# Patient Record
Sex: Male | Born: 1955
Health system: Southern US, Community
[De-identification: ages and names within clinical notes are randomized; demographics above are authoritative.]

## PROBLEM LIST (undated history)

## (undated) HISTORY — PX: COLONOSCOPY: SHX174

---

## 2016-06-02 DIAGNOSIS — K080 Exfoliation of teeth due to systemic causes: Secondary | ICD-10-CM | POA: Diagnosis not present

## 2016-12-14 DIAGNOSIS — Z6824 Body mass index (BMI) 24.0-24.9, adult: Secondary | ICD-10-CM | POA: Diagnosis not present

## 2016-12-14 DIAGNOSIS — M5431 Sciatica, right side: Secondary | ICD-10-CM | POA: Diagnosis not present

## 2017-01-11 DIAGNOSIS — K080 Exfoliation of teeth due to systemic causes: Secondary | ICD-10-CM | POA: Diagnosis not present

## 2017-01-26 DIAGNOSIS — K529 Noninfective gastroenteritis and colitis, unspecified: Secondary | ICD-10-CM | POA: Diagnosis not present

## 2017-01-26 DIAGNOSIS — R109 Unspecified abdominal pain: Secondary | ICD-10-CM | POA: Diagnosis not present

## 2017-01-26 DIAGNOSIS — R1110 Vomiting, unspecified: Secondary | ICD-10-CM | POA: Diagnosis not present

## 2017-02-04 DIAGNOSIS — R140 Abdominal distension (gaseous): Secondary | ICD-10-CM | POA: Diagnosis not present

## 2017-02-04 DIAGNOSIS — R1084 Generalized abdominal pain: Secondary | ICD-10-CM | POA: Diagnosis not present

## 2017-02-24 DIAGNOSIS — K080 Exfoliation of teeth due to systemic causes: Secondary | ICD-10-CM | POA: Diagnosis not present

## 2017-02-25 DIAGNOSIS — M5431 Sciatica, right side: Secondary | ICD-10-CM | POA: Diagnosis not present

## 2017-02-25 DIAGNOSIS — Z6825 Body mass index (BMI) 25.0-25.9, adult: Secondary | ICD-10-CM | POA: Diagnosis not present

## 2017-02-27 DIAGNOSIS — M5416 Radiculopathy, lumbar region: Secondary | ICD-10-CM | POA: Diagnosis not present

## 2017-02-27 DIAGNOSIS — M545 Low back pain: Secondary | ICD-10-CM | POA: Diagnosis not present

## 2017-03-03 DIAGNOSIS — M5416 Radiculopathy, lumbar region: Secondary | ICD-10-CM | POA: Diagnosis not present

## 2017-03-08 DIAGNOSIS — M5416 Radiculopathy, lumbar region: Secondary | ICD-10-CM | POA: Diagnosis not present

## 2017-03-08 DIAGNOSIS — M5126 Other intervertebral disc displacement, lumbar region: Secondary | ICD-10-CM | POA: Diagnosis not present

## 2017-03-10 DIAGNOSIS — M5416 Radiculopathy, lumbar region: Secondary | ICD-10-CM | POA: Diagnosis not present

## 2017-08-11 DIAGNOSIS — L821 Other seborrheic keratosis: Secondary | ICD-10-CM | POA: Diagnosis not present

## 2017-08-11 DIAGNOSIS — D1722 Benign lipomatous neoplasm of skin and subcutaneous tissue of left arm: Secondary | ICD-10-CM | POA: Diagnosis not present

## 2017-08-11 DIAGNOSIS — K080 Exfoliation of teeth due to systemic causes: Secondary | ICD-10-CM | POA: Diagnosis not present

## 2017-08-11 DIAGNOSIS — L820 Inflamed seborrheic keratosis: Secondary | ICD-10-CM | POA: Diagnosis not present

## 2017-08-16 DIAGNOSIS — Z6825 Body mass index (BMI) 25.0-25.9, adult: Secondary | ICD-10-CM | POA: Diagnosis not present

## 2017-08-16 DIAGNOSIS — J029 Acute pharyngitis, unspecified: Secondary | ICD-10-CM | POA: Diagnosis not present

## 2018-01-03 DIAGNOSIS — Z125 Encounter for screening for malignant neoplasm of prostate: Secondary | ICD-10-CM | POA: Diagnosis not present

## 2018-01-03 DIAGNOSIS — Z131 Encounter for screening for diabetes mellitus: Secondary | ICD-10-CM | POA: Diagnosis not present

## 2018-01-03 DIAGNOSIS — Z13220 Encounter for screening for lipoid disorders: Secondary | ICD-10-CM | POA: Diagnosis not present

## 2018-01-17 DIAGNOSIS — E785 Hyperlipidemia, unspecified: Secondary | ICD-10-CM | POA: Diagnosis not present

## 2018-01-17 DIAGNOSIS — Z0000 Encounter for general adult medical examination without abnormal findings: Secondary | ICD-10-CM | POA: Diagnosis not present

## 2018-01-17 DIAGNOSIS — Z6824 Body mass index (BMI) 24.0-24.9, adult: Secondary | ICD-10-CM | POA: Diagnosis not present

## 2018-02-28 DIAGNOSIS — K080 Exfoliation of teeth due to systemic causes: Secondary | ICD-10-CM | POA: Diagnosis not present

## 2018-04-18 DIAGNOSIS — E785 Hyperlipidemia, unspecified: Secondary | ICD-10-CM | POA: Diagnosis not present

## 2018-04-25 DIAGNOSIS — E785 Hyperlipidemia, unspecified: Secondary | ICD-10-CM | POA: Diagnosis not present

## 2018-04-25 DIAGNOSIS — Z6822 Body mass index (BMI) 22.0-22.9, adult: Secondary | ICD-10-CM | POA: Diagnosis not present

## 2018-10-17 DIAGNOSIS — K080 Exfoliation of teeth due to systemic causes: Secondary | ICD-10-CM | POA: Diagnosis not present

## 2018-11-25 ENCOUNTER — Encounter: Payer: Self-pay | Admitting: Gastroenterology

## 2019-03-01 DIAGNOSIS — L03011 Cellulitis of right finger: Secondary | ICD-10-CM | POA: Diagnosis not present

## 2019-03-03 DIAGNOSIS — L02511 Cutaneous abscess of right hand: Secondary | ICD-10-CM | POA: Diagnosis not present

## 2019-03-06 DIAGNOSIS — L02511 Cutaneous abscess of right hand: Secondary | ICD-10-CM | POA: Diagnosis not present

## 2019-03-22 DIAGNOSIS — L02511 Cutaneous abscess of right hand: Secondary | ICD-10-CM | POA: Diagnosis not present

## 2019-05-03 DIAGNOSIS — K4020 Bilateral inguinal hernia, without obstruction or gangrene, not specified as recurrent: Secondary | ICD-10-CM | POA: Diagnosis not present

## 2019-05-15 DIAGNOSIS — K4020 Bilateral inguinal hernia, without obstruction or gangrene, not specified as recurrent: Secondary | ICD-10-CM | POA: Diagnosis not present

## 2019-05-15 DIAGNOSIS — M1990 Unspecified osteoarthritis, unspecified site: Secondary | ICD-10-CM | POA: Diagnosis not present

## 2019-05-15 DIAGNOSIS — Z792 Long term (current) use of antibiotics: Secondary | ICD-10-CM | POA: Diagnosis not present

## 2019-05-15 DIAGNOSIS — Z79899 Other long term (current) drug therapy: Secondary | ICD-10-CM | POA: Diagnosis not present

## 2019-05-15 DIAGNOSIS — E7800 Pure hypercholesterolemia, unspecified: Secondary | ICD-10-CM | POA: Diagnosis not present

## 2019-05-15 DIAGNOSIS — G8918 Other acute postprocedural pain: Secondary | ICD-10-CM | POA: Diagnosis not present

## 2019-09-29 HISTORY — PX: HERNIA REPAIR: SHX51

## 2019-10-25 DIAGNOSIS — Z01818 Encounter for other preprocedural examination: Secondary | ICD-10-CM | POA: Diagnosis not present

## 2019-11-07 DIAGNOSIS — Z20822 Contact with and (suspected) exposure to covid-19: Secondary | ICD-10-CM | POA: Diagnosis not present

## 2019-11-07 DIAGNOSIS — Z20828 Contact with and (suspected) exposure to other viral communicable diseases: Secondary | ICD-10-CM | POA: Diagnosis not present

## 2019-11-14 DIAGNOSIS — Z800 Family history of malignant neoplasm of digestive organs: Secondary | ICD-10-CM | POA: Diagnosis not present

## 2019-11-14 DIAGNOSIS — Z86010 Personal history of colonic polyps: Secondary | ICD-10-CM | POA: Diagnosis not present

## 2019-11-14 DIAGNOSIS — Z09 Encounter for follow-up examination after completed treatment for conditions other than malignant neoplasm: Secondary | ICD-10-CM | POA: Diagnosis not present

## 2019-11-14 DIAGNOSIS — K648 Other hemorrhoids: Secondary | ICD-10-CM | POA: Diagnosis not present

## 2019-11-14 DIAGNOSIS — Z79899 Other long term (current) drug therapy: Secondary | ICD-10-CM | POA: Diagnosis not present

## 2020-02-12 DIAGNOSIS — Z23 Encounter for immunization: Secondary | ICD-10-CM | POA: Diagnosis not present

## 2020-02-12 DIAGNOSIS — Z0000 Encounter for general adult medical examination without abnormal findings: Secondary | ICD-10-CM | POA: Diagnosis not present

## 2020-02-12 DIAGNOSIS — Z13220 Encounter for screening for lipoid disorders: Secondary | ICD-10-CM | POA: Diagnosis not present

## 2020-02-12 DIAGNOSIS — Z125 Encounter for screening for malignant neoplasm of prostate: Secondary | ICD-10-CM | POA: Diagnosis not present

## 2020-02-12 DIAGNOSIS — Z131 Encounter for screening for diabetes mellitus: Secondary | ICD-10-CM | POA: Diagnosis not present

## 2020-02-12 DIAGNOSIS — Z1211 Encounter for screening for malignant neoplasm of colon: Secondary | ICD-10-CM | POA: Diagnosis not present

## 2020-09-25 DIAGNOSIS — U071 COVID-19: Secondary | ICD-10-CM | POA: Diagnosis not present

## 2020-09-25 DIAGNOSIS — R059 Cough, unspecified: Secondary | ICD-10-CM | POA: Diagnosis not present

## 2020-09-25 DIAGNOSIS — Z20822 Contact with and (suspected) exposure to covid-19: Secondary | ICD-10-CM | POA: Diagnosis not present

## 2020-09-28 DIAGNOSIS — U071 COVID-19: Secondary | ICD-10-CM

## 2020-09-28 HISTORY — DX: COVID-19: U07.1

## 2021-08-11 ENCOUNTER — Emergency Department (HOSPITAL_COMMUNITY)
Admission: EM | Admit: 2021-08-11 | Discharge: 2021-08-11 | Disposition: A | Discharge status: Home / Self Care | Payer: Federal, State, Local not specified - PPO | Attending: Emergency Medicine | Admitting: Emergency Medicine

## 2021-08-11 ENCOUNTER — Emergency Department (HOSPITAL_COMMUNITY): Payer: Federal, State, Local not specified - PPO

## 2021-08-11 ENCOUNTER — Other Ambulatory Visit: Payer: Self-pay

## 2021-08-11 DIAGNOSIS — M47812 Spondylosis without myelopathy or radiculopathy, cervical region: Secondary | ICD-10-CM | POA: Diagnosis not present

## 2021-08-11 DIAGNOSIS — M19011 Primary osteoarthritis, right shoulder: Secondary | ICD-10-CM | POA: Diagnosis not present

## 2021-08-11 DIAGNOSIS — S52124A Nondisplaced fracture of head of right radius, initial encounter for closed fracture: Secondary | ICD-10-CM | POA: Insufficient documentation

## 2021-08-11 DIAGNOSIS — M25562 Pain in left knee: Secondary | ICD-10-CM | POA: Insufficient documentation

## 2021-08-11 DIAGNOSIS — M542 Cervicalgia: Secondary | ICD-10-CM | POA: Insufficient documentation

## 2021-08-11 DIAGNOSIS — M25529 Pain in unspecified elbow: Secondary | ICD-10-CM | POA: Diagnosis not present

## 2021-08-11 DIAGNOSIS — Z043 Encounter for examination and observation following other accident: Secondary | ICD-10-CM | POA: Diagnosis not present

## 2021-08-11 DIAGNOSIS — S59902A Unspecified injury of left elbow, initial encounter: Secondary | ICD-10-CM | POA: Diagnosis not present

## 2021-08-11 DIAGNOSIS — M25521 Pain in right elbow: Secondary | ICD-10-CM | POA: Diagnosis not present

## 2021-08-11 DIAGNOSIS — M4602 Spinal enthesopathy, cervical region: Secondary | ICD-10-CM | POA: Diagnosis not present

## 2021-08-11 DIAGNOSIS — W19XXXA Unspecified fall, initial encounter: Secondary | ICD-10-CM | POA: Diagnosis not present

## 2021-08-11 DIAGNOSIS — W11XXXA Fall on and from ladder, initial encounter: Secondary | ICD-10-CM | POA: Insufficient documentation

## 2021-08-11 DIAGNOSIS — S52131A Displaced fracture of neck of right radius, initial encounter for closed fracture: Secondary | ICD-10-CM | POA: Diagnosis not present

## 2021-08-11 DIAGNOSIS — S52022A Displaced fracture of olecranon process without intraarticular extension of left ulna, initial encounter for closed fracture: Secondary | ICD-10-CM | POA: Diagnosis not present

## 2021-08-11 DIAGNOSIS — S42402A Unspecified fracture of lower end of left humerus, initial encounter for closed fracture: Secondary | ICD-10-CM

## 2021-08-11 DIAGNOSIS — M4313 Spondylolisthesis, cervicothoracic region: Secondary | ICD-10-CM | POA: Diagnosis not present

## 2021-08-11 DIAGNOSIS — S199XXA Unspecified injury of neck, initial encounter: Secondary | ICD-10-CM | POA: Diagnosis not present

## 2021-08-11 DIAGNOSIS — S52042A Displaced fracture of coronoid process of left ulna, initial encounter for closed fracture: Secondary | ICD-10-CM

## 2021-08-11 MED ORDER — FENTANYL CITRATE PF 50 MCG/ML IJ SOSY
50.0000 ug | PREFILLED_SYRINGE | INTRAMUSCULAR | Status: DC | PRN
  Administered 2021-08-11: 50 ug via INTRAVENOUS
  Filled 2021-08-11: qty 1

## 2021-08-11 MED ORDER — ACETAMINOPHEN 500 MG PO TABS
1000.0000 mg | ORAL_TABLET | Freq: Once | ORAL | Status: AC
  Administered 2021-08-11: 1000 mg via ORAL
  Filled 2021-08-11: qty 2

## 2021-08-11 MED ORDER — OXYCODONE-ACETAMINOPHEN 5-325 MG PO TABS
2.0000 | ORAL_TABLET | Freq: Once | ORAL | Status: AC
  Administered 2021-08-11: 2 via ORAL
  Filled 2021-08-11: qty 2

## 2021-08-11 MED ORDER — HYDROCODONE-ACETAMINOPHEN 5-325 MG PO TABS: 2.0000 | ORAL_TABLET | Freq: Four times a day (QID) | ORAL | 0 refills | Status: DC | PRN

## 2021-08-11 NOTE — ED Notes (Signed)
RN reviewed discharge instructions with pt. Pt verbalized understanding and had no further questions. VSS upon discharge.  

## 2021-08-11 NOTE — ED Triage Notes (Signed)
Arrived via EMS; reported fell off of 8 ft ladder while cleaning the gutters; landed on concrete on his bilateral elbow and face. Denies LOC; denies medical history. Fentanyl 150 mcg given en route. Patient arrived in room, + C colar; A&Ox4.

## 2021-08-11 NOTE — ED Provider Notes (Signed)
[facility] EMERGENCY DEPARTMENT Provider Note   CSN: [id number] Arrival date & time: 08/11/21  1525     History Chief Complaint  Patient presents with   David Gates    David Gates is a 65 y.o. male.  HPI Patient is a 65 year old male presented to the emergency room today with complaints of bilateral elbow pain, left knee pain, mild neck pain states that he was on a ladder doing work on his house when the ladder slid out from underneath him and he fell to the concrete ground regularly and both elbows and both knees but states that his left knee took the vast majority of the impact.  He states that he has no suspected ground however he states he does not have any head injury or loss of consciousness denies any back pain other than some mild pain in his neck denies any chest pain abdominal pain or pelvic pain.  No difficulty breathing no chest pain or shortness of breath.  He states his pain is achy constant worse with touch and movement states it is significantly improved with 100 mcg of fentanyl by EMS in route.       No past medical history on file.  There are no problems to display for this patient.   The histories are not reviewed yet. Please review them in the "History" navigator section and refresh this SmartLink.     No family history on file.     Home Medications Prior to Admission medications   Not on File    Allergies    Patient has no known allergies.  Review of Systems   Review of Systems  Constitutional:  Negative for chills and fever.  HENT:  Negative for congestion.   Eyes:  Negative for pain.  Respiratory:  Negative for cough and shortness of breath.   Cardiovascular:  Negative for chest pain and leg swelling.  Gastrointestinal:  Negative for abdominal pain and vomiting.  Genitourinary:  Negative for dysuria.  Musculoskeletal:  Negative for myalgias.       Bilateral elbow pain, left knee pain, neck pain  Skin:  Negative for rash.   Neurological:  Negative for dizziness and headaches.   Physical Exam Updated Vital Signs BP (!) 154/80   Pulse (!) 58   Temp 97.9 F (36.6 C) (Oral)   Resp (!) 21   Ht 5\' 10"  (1.778 m)   Wt 77.1 kg   SpO2 98%   BMI 24.39 kg/m   Physical Exam Vitals and nursing note reviewed.  Constitutional:      General: He is not in acute distress.    Comments: Uncomfortable but pleasant and well-appearing 65 year old male not acutely ill-appearing or toxic appearing.  He is speaking in full sentences.  Able answer questions but with  HENT:     Head: Normocephalic and atraumatic.     Nose: Nose normal.     Mouth/Throat:     Mouth: Mucous membranes are moist.  Eyes:     General: No scleral icterus. Cardiovascular:     Rate and Rhythm: Normal rate and regular rhythm.     Pulses: Normal pulses.     Heart sounds: Normal heart sounds.  Pulmonary:     Effort: Pulmonary effort is normal. No respiratory distress.     Breath sounds: No wheezing.  Abdominal:     Palpations: Abdomen is soft.     Tenderness: There is no abdominal tenderness. There is no guarding or rebound.  Musculoskeletal:  Cervical back: Normal range of motion.     Right lower leg: No edema.     Left lower leg: No edema.     Comments: In cervical collar No significant thoracic or lumbar midline vertebral tenderness to palpation no step-off or deformity. Bilateral radial artery and dorsalis pedis pulses are 3+ and symmetric  There is focal bony tenderness of both elbows diffuse mild tenderness palpation of the right shoulder with no deformity.  No chest wall tenderness, no facial or cranial tenderness.  No septal hematomas of the nose  Patient has intact sensation grossly in lower and upper extremities.     Skin:    General: Skin is warm and dry.     Capillary Refill: Capillary refill takes less than 2 seconds.  Neurological:     Mental Status: He is alert. Mental status is at baseline.  Psychiatric:        Mood  and Affect: Mood normal.        Behavior: Behavior normal.    ED Results / Procedures / Treatments   Labs (all labs ordered are listed, but only abnormal results are displayed) Labs Reviewed - No data to display  EKG None  Radiology DG Shoulder Right  Result Date: 08/11/2021 CLINICAL DATA:  Fall from ladder. EXAM: RIGHT SHOULDER - 2+ VIEW COMPARISON:  None. FINDINGS: There is no evidence for fracture or dislocation. There is mild degenerative narrowing of the acromioclavicular joint. The soft tissues are within normal limits. IMPRESSION: No acute bony abnormality of the right shoulder. Electronically Signed   By: Darliss Cheney M.D.   On: 08/11/2021 17:53   DG ELBOW COMPLETE LEFT (3+VIEW)  Result Date: 08/11/2021 CLINICAL DATA:  Acute LEFT elbow pain following fall. Initial encounter. EXAM: LEFT ELBOW - COMPLETE 3+ VIEW COMPARISON:  None. FINDINGS: A displaced comminuted fracture at the base of the olecranon is identified with distraction of at least 2.5 cm. Overlying soft tissue swelling is noted. No other fractures are identified on this study. IMPRESSION: Displaced comminuted olecranon fracture. Electronically Signed   By: Harmon Pier M.D.   On: 08/11/2021 18:02   DG Elbow Complete Right  Result Date: 08/11/2021 CLINICAL DATA:  Acute RIGHT elbow pain following fall. Initial encounter. EXAM: RIGHT ELBOW - COMPLETE 3+ VIEW COMPARISON:  None. FINDINGS: Please note that this study is slightly limits to it secondary to patient's limited mobility. Essentially nondisplaced radial neck fracture is noted with possible extension into the radial head. There is a strong suggestion of a ulnar coronoid process fracture as well. There is some offset of the humeral ulnar joint which may be technical. A joint effusion is noted. IMPRESSION: 1. Slightly limited study secondary to patient's limited mobility. 2. Radial neck fracture with possible extension into the radial head. 3. Probable ulnar coronoid  process fracture. 4. Elbow joint effusion. 5. Slight offset of the humeral ulnar joint which may be technical. Consider follow-up or further imaging as indicated. Electronically Signed   By: Harmon Pier M.D.   On: 08/11/2021 17:57   DG Knee Complete 4 Views Left  Result Date: 08/11/2021 CLINICAL DATA:  Acute LEFT knee pain following fall today. Initial encounter. EXAM: LEFT KNEE - COMPLETE 4+ VIEW COMPARISON:  None. FINDINGS: No evidence of fracture, dislocation, or definite joint effusion. No evidence of arthropathy or other focal bone abnormality. Soft tissues are unremarkable. IMPRESSION: Negative. Electronically Signed   By: Harmon Pier M.D.   On: 08/11/2021 17:58    Procedures Procedures  Medications Ordered in ED Medications  fentaNYL (SUBLIMAZE) injection 50 mcg (50 mcg Intravenous Given 08/11/21 1647)  acetaminophen (TYLENOL) tablet 1,000 mg (1,000 mg Oral Given 08/11/21 1647)    ED Course  I have reviewed the triage vital signs and the nursing notes.  Pertinent labs & imaging results that were available during my care of the patient were reviewed by me and considered in my medical decision making (see chart for details).  Clinical Course as of 08/11/21 Renee Rival Aug 11, 2021  1826 DG Elbow Complete Right IMPRESSION: 1. Slightly limited study secondary to patient's limited mobility. 2. Radial neck fracture with possible extension into the radial head. 3. Probable ulnar coronoid process fracture. 4. Elbow joint effusion. 5. Slight offset of the humeral ulnar joint which may be technical. Consider follow-up or further imaging as indicated.   [WF]  1826 DG ELBOW COMPLETE LEFT (3+VIEW) IMPRESSION: Displaced comminuted olecranon fracture.  [WF]    Clinical Course User Index [WF] Gailen Shelter, [place]   MDM Rules/Calculators/A&P                          Patient here after his ladder and slid down the house that he was standing on.  The ladder came to the ground  patient landed on both elbows and his left knee.  States that he is only hurting in his elbows and his left knee.  States that the pain is severe in both elbows.  Denies any significant pain elsewhere apart from some mild neck pain some of this may be because he is in a cervical collar.  Full trauma assessment from head to toe reveals that he is primarily very tenderness left and right elbow has good distal pulses.  Patient's pain is well controlled at this time we will give him a small aliquot of fentanyl to continue pain control as well as some Tylenol for more long-term pain control.  X-rays show left olecranon fracture and right radial head fracture will discuss with orthopedics.  CT cervical spine without acute abnormality.   Discussed with Dr. Susa Simmonds of orthopedic surgery who requests CT scan of right elbow due to limited x-ray. If elbow is without dislocation sling and follow-up with orthopedics.   Patient care handed off to Dr. Charm Barges to follow-up on CT imaging.    Patient will follow up with Dr. Susa Simmonds.  Prescription for Norco sent to pharmacy.  Tylenol ibuprofen recommendations given.  Final Clinical Impression(s) / ED Diagnoses Final diagnoses:  Fall  Closed fracture of left elbow, initial encounter  Closed nondisplaced fracture of head of right radius, initial encounter    Rx / DC Orders ED Discharge Orders     None        Gailen Shelter, [place] 08/11/21 1920    Terrilee Files, MD 08/11/21 [phone]

## 2021-08-11 NOTE — Progress Notes (Signed)
Orthopedic Tech Progress Note Patient Details:  David Gates Feb 21, 1956 [id number]  Ortho Devices Type of Ortho Device: Arm sling, Post (long arm) splint Ortho Device/Splint Location: Bi-lateral Ortho Device/Splint Interventions: Ordered, Application, Adjustment  [name] assisted with the lue. Then they called for the rue to be splinted after she left for the day. Post Interventions Patient Tolerated: Well Instructions Provided: Care of device, Adjustment of device  [name] 08/11/2021, 9:20 PM

## 2021-08-11 NOTE — Discharge Instructions (Addendum)
You have a left elbow fracture and a right radial head (elbow) fracture.   Your knee x-ray and your neck CT spine were unremarkable.  I discussed her case with one of our orthopedic doctors who I have given you the information for.  Please wear the splint on your left arm until you have follow-up with the orthopedic doctor. Their office will be reaching out to you.  I prescribed you a short course of stronger pain medicine.  Please take as needed you may also use Tylenol and ibuprofen as discussed below if you do take a dose of Norco please take half a dose of Tylenol you normally would no more than 500 mg.  Please use Tylenol or ibuprofen for pain.  You may use 600 mg ibuprofen every 6 hours or 1000 mg of Tylenol every 6 hours.  You may choose to alternate between the 2.  This would be most effective.  Not to exceed 4 g of Tylenol within 24 hours.  Not to exceed 3200 mg ibuprofen 24 hours.

## 2021-08-12 DIAGNOSIS — M25521 Pain in right elbow: Secondary | ICD-10-CM | POA: Diagnosis not present

## 2021-08-12 DIAGNOSIS — M25522 Pain in left elbow: Secondary | ICD-10-CM | POA: Diagnosis not present

## 2021-08-13 NOTE — H&P (Signed)
PREOPERATIVE H&P  Chief Complaint: bilateral elbow fracture  HPI: David Gates is a 65 y.o. male who is scheduled for, Procedure(s): OPEN REDUCTION INTERNAL FIXATION (ORIF) DISTAL HUMERUS FRACTURE OPEN REDUCTION INTERNAL FIXATION (ORIF) ELBOW/OLECRANON FRACTURE.   Patient is a healthy 65 year old who had an injury on 11/14 when he fell off a ladder. He landed on the concrete below him. He landed directly on his elbows and knees. He was taken to [facility] ED. Work-up was significant for bilateral elbow fractures. He was sent to orthopedics for surgical discussion.   His symptoms are rated as moderate to severe, and have been worsening.  This is significantly impairing activities of daily living.    Please see clinic note for further details on this patient's care.    He has elected for surgical management.   No past medical history on file.  Social History   Socioeconomic History   Marital status: Married    Spouse name: Not on file   Number of children: Not on file   Years of education: Not on file   Highest education level: Not on file  Occupational History   Not on file  Tobacco Use   Smoking status: Not on file   Smokeless tobacco: Not on file  Substance and Sexual Activity   Alcohol use: Not on file   Drug use: Not on file   Sexual activity: Not on file  Other Topics Concern   Not on file  Social History Narrative   Not on file   Social Determinants of Health   Financial Resource Strain: Not on file  Food Insecurity: Not on file  Transportation Needs: Not on file  Physical Activity: Not on file  Stress: Not on file  Social Connections: Not on file   No family history on file. No Known Allergies Prior to Admission medications   Medication Sig Start Date End Date Taking? Authorizing Provider  acetaminophen (TYLENOL) 500 MG tablet Take 1,000 mg by mouth every 6 (six) hours as needed for mild pain or moderate pain.   Yes [provider]   HYDROcodone-acetaminophen (NORCO/VICODIN) 5-325 MG tablet Take 2 tablets by mouth every 6 (six) hours as needed. 08/11/21  Yes Fondaw, Wylder S, PA    ROS: All other systems have been reviewed and were otherwise negative with the exception of those mentioned in the HPI and as above.  Physical Exam: General: Alert, no acute distress Cardiovascular: No pedal edema Respiratory: No cyanosis, no use of accessory musculature GI: No organomegaly, abdomen is soft and non-tender Skin: No lesions in the area of chief complaint Neurologic: Sensation intact distally Psychiatric: Patient is competent for consent with normal mood and affect Lymphatic: No axillary or cervical lymphadenopathy  MUSCULOSKELETAL:  RUE: Splint CDI. Skin intact though cannot assess fully beneath splint. Nontender to palpation proximally. Distal motor and sensory function grossly intact. Well perfused digits.  LUE: Splint CDI. Skin intact though cannot assess fully beneath splint. Nontender to palpation proximally. + Distal motor and sensory function grossly intact. Well perfused digits.   Imaging: CT of the left elbow demonstrates a displaced olecranon fracture  CT of the right elbow demonstrates a radial neck fracture, coronoid fracture, and significant soft tissue edema  Assessment: bilateral elbow fracture  Plan: Plan for Procedure(s): OPEN REDUCTION INTERNAL FIXATION (ORIF) DISTAL HUMERUS FRACTURE OPEN REDUCTION INTERNAL FIXATION (ORIF) ELBOW/OLECRANON FRACTURE  The risks benefits and alternatives were discussed with the patient including but not limited to the risks  of nonoperative treatment, versus surgical intervention including infection, bleeding, nerve injury,  blood clots, cardiopulmonary complications, morbidity, mortality, among others, and they were willing to proceed.   The patient acknowledged the explanation, agreed to proceed with the plan and consent was signed.   Operative Plan:   - RIGHT: orif  coronoid process, orif radial head versus radial head arthroplasty, and possible lateral ligament repair  - LEFT: ORIF olecranon fracture Discharge Medications: Discharge medications sent to pharmacy prior to surgery DVT Prophylaxis: None Physical Therapy: Outpatient PT Special Discharge needs: Splint. Sling   Vernetta Honey, PA-C  08/13/2021 3:41 PM

## 2021-08-14 ENCOUNTER — Other Ambulatory Visit: Payer: Self-pay

## 2021-08-14 ENCOUNTER — Encounter (HOSPITAL_COMMUNITY): Payer: Self-pay | Admitting: Orthopaedic Surgery

## 2021-08-14 NOTE — Progress Notes (Signed)
Spoke with pt's wife, David Gates for pre-op call. Pt was in the room with her and phone was on speaker. They deny any past medical history.   Pt's surgery is scheduled as ambulatory so no Covid test is required prior to surgery.

## 2021-08-15 ENCOUNTER — Ambulatory Visit (HOSPITAL_COMMUNITY)
Admission: RE | Admit: 2021-08-15 | Discharge: 2021-08-15 | Disposition: A | Discharge status: Home / Self Care | Payer: Federal, State, Local not specified - PPO | Attending: Orthopaedic Surgery | Admitting: Orthopaedic Surgery

## 2021-08-15 ENCOUNTER — Encounter (HOSPITAL_COMMUNITY): Payer: Self-pay | Admitting: Orthopaedic Surgery

## 2021-08-15 ENCOUNTER — Ambulatory Visit (HOSPITAL_COMMUNITY): Payer: Federal, State, Local not specified - PPO

## 2021-08-15 ENCOUNTER — Ambulatory Visit (HOSPITAL_COMMUNITY): Payer: Federal, State, Local not specified - PPO | Admitting: Anesthesiology

## 2021-08-15 ENCOUNTER — Encounter (HOSPITAL_COMMUNITY)
Admission: RE | Disposition: A | Discharge status: Home / Self Care | Payer: Self-pay | Source: Home / Self Care | Attending: Orthopaedic Surgery

## 2021-08-15 ENCOUNTER — Other Ambulatory Visit: Payer: Self-pay

## 2021-08-15 DIAGNOSIS — S52121A Displaced fracture of head of right radius, initial encounter for closed fracture: Secondary | ICD-10-CM | POA: Diagnosis not present

## 2021-08-15 DIAGNOSIS — S52202A Unspecified fracture of shaft of left ulna, initial encounter for closed fracture: Secondary | ICD-10-CM | POA: Diagnosis not present

## 2021-08-15 DIAGNOSIS — S52021A Displaced fracture of olecranon process without intraarticular extension of right ulna, initial encounter for closed fracture: Secondary | ICD-10-CM | POA: Diagnosis not present

## 2021-08-15 DIAGNOSIS — Z419 Encounter for procedure for purposes other than remedying health state, unspecified: Secondary | ICD-10-CM

## 2021-08-15 DIAGNOSIS — S53431A Radial collateral ligament sprain of right elbow, initial encounter: Secondary | ICD-10-CM | POA: Diagnosis not present

## 2021-08-15 DIAGNOSIS — S53432A Radial collateral ligament sprain of left elbow, initial encounter: Secondary | ICD-10-CM | POA: Diagnosis not present

## 2021-08-15 DIAGNOSIS — Z09 Encounter for follow-up examination after completed treatment for conditions other than malignant neoplasm: Secondary | ICD-10-CM

## 2021-08-15 DIAGNOSIS — S53105A Unspecified dislocation of left ulnohumeral joint, initial encounter: Secondary | ICD-10-CM | POA: Diagnosis not present

## 2021-08-15 DIAGNOSIS — W11XXXA Fall on and from ladder, initial encounter: Secondary | ICD-10-CM | POA: Insufficient documentation

## 2021-08-15 DIAGNOSIS — S52022D Displaced fracture of olecranon process without intraarticular extension of left ulna, subsequent encounter for closed fracture with routine healing: Secondary | ICD-10-CM | POA: Diagnosis not present

## 2021-08-15 DIAGNOSIS — S52022A Displaced fracture of olecranon process without intraarticular extension of left ulna, initial encounter for closed fracture: Secondary | ICD-10-CM | POA: Insufficient documentation

## 2021-08-15 DIAGNOSIS — S52181A Other fracture of upper end of right radius, initial encounter for closed fracture: Secondary | ICD-10-CM | POA: Diagnosis not present

## 2021-08-15 DIAGNOSIS — S52131A Displaced fracture of neck of right radius, initial encounter for closed fracture: Secondary | ICD-10-CM | POA: Diagnosis not present

## 2021-08-15 HISTORY — PX: ORIF ELBOW FRACTURE: SHX5031

## 2021-08-15 HISTORY — PX: ORIF HUMERUS FRACTURE: SHX2126

## 2021-08-15 SURGERY — OPEN REDUCTION INTERNAL FIXATION (ORIF) DISTAL HUMERUS FRACTURE
Anesthesia: Regional | Site: Elbow | Laterality: Right

## 2021-08-15 MED ORDER — BUPIVACAINE HCL (PF) 0.25 % IJ SOLN
INTRAMUSCULAR | Status: AC
  Filled 2021-08-15: qty 30

## 2021-08-15 MED ORDER — VANCOMYCIN HCL 1000 MG IV SOLR
INTRAVENOUS | Status: AC
  Filled 2021-08-15: qty 40

## 2021-08-15 MED ORDER — EPHEDRINE 5 MG/ML INJ
INTRAVENOUS | Status: AC
  Filled 2021-08-15: qty 5

## 2021-08-15 MED ORDER — ACETAMINOPHEN 160 MG/5ML PO SOLN: 1000.0000 mg | Freq: Once | ORAL | Status: DC | PRN

## 2021-08-15 MED ORDER — CEFAZOLIN SODIUM-DEXTROSE 2-4 GM/100ML-% IV SOLN
2.0000 g | INTRAVENOUS | Status: AC
  Administered 2021-08-15: 2 g via INTRAVENOUS

## 2021-08-15 MED ORDER — OXYCODONE HCL 5 MG PO TABS
ORAL_TABLET | ORAL | Status: AC
  Filled 2021-08-15: qty 1

## 2021-08-15 MED ORDER — CLONIDINE HCL (ANALGESIA) 100 MCG/ML EP SOLN
EPIDURAL | Status: DC | PRN
  Administered 2021-08-15: 100 ug

## 2021-08-15 MED ORDER — FENTANYL CITRATE (PF) 100 MCG/2ML IJ SOLN
INTRAMUSCULAR | Status: AC
  Filled 2021-08-15: qty 2

## 2021-08-15 MED ORDER — CHLORHEXIDINE GLUCONATE 0.12 % MT SOLN
OROMUCOSAL | Status: AC
  Administered 2021-08-15: 15 mL via OROMUCOSAL
  Filled 2021-08-15: qty 15

## 2021-08-15 MED ORDER — BUPIVACAINE-EPINEPHRINE (PF) 0.5% -1:200000 IJ SOLN
INTRAMUSCULAR | Status: DC | PRN
  Administered 2021-08-15: 30 mL via PERINEURAL

## 2021-08-15 MED ORDER — CHLORHEXIDINE GLUCONATE 0.12 % MT SOLN: 15.0000 mL | Freq: Once | OROMUCOSAL | Status: AC

## 2021-08-15 MED ORDER — OXYCODONE HCL 5 MG/5ML PO SOLN: 5.0000 mg | Freq: Once | ORAL | Status: AC | PRN

## 2021-08-15 MED ORDER — ACETAMINOPHEN 10 MG/ML IV SOLN
1000.0000 mg | Freq: Once | INTRAVENOUS | Status: DC | PRN
  Administered 2021-08-15: 1000 mg via INTRAVENOUS

## 2021-08-15 MED ORDER — ONDANSETRON HCL 4 MG/2ML IJ SOLN
INTRAMUSCULAR | Status: DC | PRN
  Administered 2021-08-15: 4 mg via INTRAVENOUS

## 2021-08-15 MED ORDER — OXYCODONE HCL 5 MG PO TABS
5.0000 mg | ORAL_TABLET | Freq: Once | ORAL | Status: AC
  Administered 2021-08-15: 5 mg via ORAL

## 2021-08-15 MED ORDER — LACTATED RINGERS IV SOLN: INTRAVENOUS | Status: DC

## 2021-08-15 MED ORDER — 0.9 % SODIUM CHLORIDE (POUR BTL) OPTIME
TOPICAL | Status: DC | PRN
  Administered 2021-08-15: 1000 mL

## 2021-08-15 MED ORDER — ORAL CARE MOUTH RINSE: 15.0000 mL | Freq: Once | OROMUCOSAL | Status: AC

## 2021-08-15 MED ORDER — EPHEDRINE SULFATE-NACL 50-0.9 MG/10ML-% IV SOSY
PREFILLED_SYRINGE | INTRAVENOUS | Status: DC | PRN
  Administered 2021-08-15: 10 mg via INTRAVENOUS

## 2021-08-15 MED ORDER — MIDAZOLAM HCL 2 MG/2ML IJ SOLN
INTRAMUSCULAR | Status: AC
  Administered 2021-08-15: 2 mg via INTRAVENOUS
  Filled 2021-08-15: qty 2

## 2021-08-15 MED ORDER — ROCURONIUM BROMIDE 10 MG/ML (PF) SYRINGE
PREFILLED_SYRINGE | INTRAVENOUS | Status: DC | PRN
  Administered 2021-08-15: 70 mg via INTRAVENOUS

## 2021-08-15 MED ORDER — ROCURONIUM BROMIDE 10 MG/ML (PF) SYRINGE
PREFILLED_SYRINGE | INTRAVENOUS | Status: AC
  Filled 2021-08-15: qty 10

## 2021-08-15 MED ORDER — FENTANYL CITRATE (PF) 250 MCG/5ML IJ SOLN
INTRAMUSCULAR | Status: DC | PRN
  Administered 2021-08-15: 50 ug via INTRAVENOUS
  Administered 2021-08-15: 100 ug via INTRAVENOUS

## 2021-08-15 MED ORDER — PROPOFOL 10 MG/ML IV BOLUS
INTRAVENOUS | Status: AC
  Filled 2021-08-15: qty 20

## 2021-08-15 MED ORDER — SUGAMMADEX SODIUM 200 MG/2ML IV SOLN
INTRAVENOUS | Status: DC | PRN
  Administered 2021-08-15: 200 mg via INTRAVENOUS

## 2021-08-15 MED ORDER — FENTANYL CITRATE (PF) 100 MCG/2ML IJ SOLN: 50.0000 ug | Freq: Once | INTRAMUSCULAR | Status: AC

## 2021-08-15 MED ORDER — FENTANYL CITRATE (PF) 100 MCG/2ML IJ SOLN
25.0000 ug | INTRAMUSCULAR | Status: DC | PRN
  Administered 2021-08-15 (×3): 50 ug via INTRAVENOUS

## 2021-08-15 MED ORDER — VANCOMYCIN HCL 1000 MG IV SOLR
INTRAVENOUS | Status: DC | PRN
  Administered 2021-08-15: 1000 mg
  Administered 2021-08-15: 2 g via TOPICAL

## 2021-08-15 MED ORDER — ACETAMINOPHEN 10 MG/ML IV SOLN
INTRAVENOUS | Status: AC
  Filled 2021-08-15: qty 100

## 2021-08-15 MED ORDER — ONDANSETRON HCL 4 MG/2ML IJ SOLN
INTRAMUSCULAR | Status: AC
  Filled 2021-08-15: qty 2

## 2021-08-15 MED ORDER — CEFAZOLIN SODIUM-DEXTROSE 2-4 GM/100ML-% IV SOLN
INTRAVENOUS | Status: AC
  Filled 2021-08-15: qty 100

## 2021-08-15 MED ORDER — BUPIVACAINE HCL (PF) 0.25 % IJ SOLN
INTRAMUSCULAR | Status: DC | PRN
  Administered 2021-08-15: 20 mL

## 2021-08-15 MED ORDER — DEXAMETHASONE SODIUM PHOSPHATE 10 MG/ML IJ SOLN
INTRAMUSCULAR | Status: DC | PRN
  Administered 2021-08-15: 10 mg via INTRAVENOUS

## 2021-08-15 MED ORDER — FENTANYL CITRATE (PF) 100 MCG/2ML IJ SOLN
INTRAMUSCULAR | Status: AC
  Administered 2021-08-15: 50 ug via INTRAVENOUS
  Filled 2021-08-15: qty 2

## 2021-08-15 MED ORDER — DEXAMETHASONE SODIUM PHOSPHATE 10 MG/ML IJ SOLN
INTRAMUSCULAR | Status: AC
  Filled 2021-08-15: qty 1

## 2021-08-15 MED ORDER — MIDAZOLAM HCL 2 MG/2ML IJ SOLN: 2.0000 mg | Freq: Once | INTRAMUSCULAR | Status: AC

## 2021-08-15 MED ORDER — FENTANYL CITRATE (PF) 250 MCG/5ML IJ SOLN
INTRAMUSCULAR | Status: AC
  Filled 2021-08-15: qty 5

## 2021-08-15 MED ORDER — PROPOFOL 10 MG/ML IV BOLUS
INTRAVENOUS | Status: DC | PRN
  Administered 2021-08-15: 100 mg via INTRAVENOUS

## 2021-08-15 MED ORDER — ACETAMINOPHEN 500 MG PO TABS: 1000.0000 mg | ORAL_TABLET | Freq: Once | ORAL | Status: DC | PRN

## 2021-08-15 MED ORDER — OXYCODONE HCL 5 MG PO TABS
5.0000 mg | ORAL_TABLET | Freq: Once | ORAL | Status: AC | PRN
  Administered 2021-08-15: 5 mg via ORAL

## 2021-08-15 SURGICAL SUPPLY — 97 items
ANCHOR JUGGERKNOT W/DRL 2/1.45 (Anchor) ×3 IMPLANT
BAG COUNTER SPONGE SURGICOUNT (BAG) ×3 IMPLANT
BENZOIN TINCTURE PRP APPL 2/3 (GAUZE/BANDAGES/DRESSINGS) ×3 IMPLANT
BIT DRILL 2 FAST STEP (BIT) ×3 IMPLANT
BIT DRILL CALIBRATED 2.7 (BIT) ×3 IMPLANT
BLADE AVERAGE 25X9 (BLADE) ×3 IMPLANT
BLADE SURG 10 STRL SS (BLADE) ×3 IMPLANT
BLADE SURG 15 STRL LF DISP TIS (BLADE) ×2 IMPLANT
BLADE SURG 15 STRL SS (BLADE) ×1
BNDG ELASTIC 4X5.8 VLCR STR LF (GAUZE/BANDAGES/DRESSINGS) ×3 IMPLANT
BNDG ELASTIC 6X10 VLCR STRL LF (GAUZE/BANDAGES/DRESSINGS) ×3 IMPLANT
BNDG ESMARK 4X9 LF (GAUZE/BANDAGES/DRESSINGS) IMPLANT
BNDG GAUZE ELAST 4 BULKY (GAUZE/BANDAGES/DRESSINGS) ×3 IMPLANT
CLSR STERI-STRIP ANTIMIC 1/2X4 (GAUZE/BANDAGES/DRESSINGS) ×3 IMPLANT
CORD BIPOLAR FORCEPS 12FT (ELECTRODE) ×3 IMPLANT
COVER SURGICAL LIGHT HANDLE (MISCELLANEOUS) ×3 IMPLANT
CUFF TOURN SGL QUICK 18X4 (TOURNIQUET CUFF) ×6 IMPLANT
DRAPE C-ARM 42X72 X-RAY (DRAPES) ×3 IMPLANT
DRAPE C-ARM MINI 42X72 WSTRAPS (DRAPES) ×3 IMPLANT
DRAPE EXTREMITY T 121X128X90 (DISPOSABLE) ×3 IMPLANT
DRAPE HALF SHEET 40X57 (DRAPES) ×3 IMPLANT
DRAPE IMP U-DRAPE 54X76 (DRAPES) ×3 IMPLANT
DRAPE INCISE IOBAN 66X45 STRL (DRAPES) ×3 IMPLANT
DRAPE SURG 17X23 STRL (DRAPES) ×3 IMPLANT
DRAPE U-SHAPE 47X51 STRL (DRAPES) ×6 IMPLANT
DRSG MEPILEX BORDER 4X8 (GAUZE/BANDAGES/DRESSINGS) ×3 IMPLANT
DRSG PAD ABDOMINAL 8X10 ST (GAUZE/BANDAGES/DRESSINGS) ×9 IMPLANT
ELECT PENCIL ROCKER SW 15FT (MISCELLANEOUS) ×3 IMPLANT
ELECT REM PT RETURN 9FT ADLT (ELECTROSURGICAL)
ELECTRODE REM PT RTRN 9FT ADLT (ELECTROSURGICAL) IMPLANT
GAUZE SPONGE 4X4 12PLY STRL (GAUZE/BANDAGES/DRESSINGS) ×3 IMPLANT
GAUZE XEROFORM 1X8 LF (GAUZE/BANDAGES/DRESSINGS) ×6 IMPLANT
GLOVE SURG ENC MOIS LTX SZ7 (GLOVE) ×3 IMPLANT
GLOVE SURG ORTHO LTX SZ7.5 (GLOVE) ×12 IMPLANT
GLOVE SURG UNDER POLY LF SZ7 (GLOVE) ×3 IMPLANT
GOWN STRL REUS W/ TWL XL LVL3 (GOWN DISPOSABLE) ×2 IMPLANT
GOWN STRL REUS W/TWL 2XL LVL3 (GOWN DISPOSABLE) ×3 IMPLANT
GOWN STRL REUS W/TWL XL LVL3 (GOWN DISPOSABLE) ×1
K-WIRE ACE 1.6X6 (WIRE) ×6
K-WIRE FIXATION 2.0X6 (WIRE) ×3
K-WIRE TROCAR .045X4.72 (WIRE) ×9
KIT BASIN OR (CUSTOM PROCEDURE TRAY) ×3 IMPLANT
KIT TURNOVER KIT B (KITS) ×3 IMPLANT
KWIRE ACE 1.6X6 (WIRE) ×4 IMPLANT
KWIRE FIXATION 2.0X6 (WIRE) ×2 IMPLANT
KWIRE TROCAR .045X4.72 (WIRE) ×6 IMPLANT
LOOP VESSEL MAXI BLUE (MISCELLANEOUS) IMPLANT
MANIFOLD NEPTUNE II (INSTRUMENTS) ×3 IMPLANT
NDL SUT 6 .5 CRC .975X.05 MAYO (NEEDLE) IMPLANT
NEEDLE HYPO 25GX1X1/2 BEV (NEEDLE) ×3 IMPLANT
NEEDLE MAYO TAPER (NEEDLE)
NS IRRIG 1000ML POUR BTL (IV SOLUTION) ×3 IMPLANT
PACK SHOULDER (CUSTOM PROCEDURE TRAY) ×3 IMPLANT
PACK UNIVERSAL I (CUSTOM PROCEDURE TRAY) ×3 IMPLANT
PAD ABD 8X10 STRL (GAUZE/BANDAGES/DRESSINGS) ×3 IMPLANT
PAD ARMBOARD 7.5X6 YLW CONV (MISCELLANEOUS) ×6 IMPLANT
PADDING CAST ABS 4INX4YD NS (CAST SUPPLIES) ×1
PADDING CAST ABS 6INX4YD NS (CAST SUPPLIES) ×1
PADDING CAST ABS COTTON 4X4 ST (CAST SUPPLIES) ×2 IMPLANT
PADDING CAST ABS COTTON 6X4 NS (CAST SUPPLIES) ×2 IMPLANT
PASSER SUT SWANSON 36MM LOOP (INSTRUMENTS) IMPLANT
PEG NONLOCK 2.5X30 (Screw) ×3 IMPLANT
PLATE OLECRANON SM (Plate) ×3 IMPLANT
PLATE PROX RAD LRG (Plate) ×3 IMPLANT
SCREW LOCK CORT STAR 3.5X12 (Screw) ×3 IMPLANT
SCREW LOCK CORT STAR 3.5X16 (Screw) ×3 IMPLANT
SCREW LOCK CORT STAR 3.5X20 (Screw) ×6 IMPLANT
SCREW LOCK CORT STAR 3.5X46 (Screw) ×3 IMPLANT
SCREW LOW PROF TIS 3.5X28MM (Screw) ×3 IMPLANT
SCREW LP NL T15 3.5X24 (Screw) ×3 IMPLANT
SCREW PEG 2.5X18 NONLOCK (Screw) ×3 IMPLANT
SCREW PEG 2.5X20 NONLOCK (Screw) ×3 IMPLANT
SCREW PEG LOCK 2.5X10 (Peg) ×3 IMPLANT
SCREW PEG LOCK 2.5X14 (Peg) ×3 IMPLANT
SCREW PEG LOCK 2.5X18 (Peg) ×6 IMPLANT
SCREW PEG LOCK 2.5X20 (Peg) ×3 IMPLANT
SCREW PEG LOCK 2.5X28 (Peg) ×3 IMPLANT
STAPLER VISISTAT 35W (STAPLE) IMPLANT
STOCKINETTE IMPERVIOUS 9X36 MD (GAUZE/BANDAGES/DRESSINGS) ×3 IMPLANT
SUT FIBERWIRE #2 38 T-5 BLUE (SUTURE) ×6
SUT MNCRL AB 3-0 PS2 27 (SUTURE) ×6 IMPLANT
SUT MNCRL AB 4-0 PS2 18 (SUTURE) ×3 IMPLANT
SUT VIC AB 0 CT1 27 (SUTURE) ×3
SUT VIC AB 0 CT1 27XBRD ANBCTR (SUTURE) ×6 IMPLANT
SUT VIC AB 2-0 CT1 27 (SUTURE) ×1
SUT VIC AB 2-0 CT1 36 (SUTURE) ×6 IMPLANT
SUT VIC AB 2-0 CT1 TAPERPNT 27 (SUTURE) ×2 IMPLANT
SUT VIC AB 3-0 FS2 27 (SUTURE) ×3 IMPLANT
SUT VIC AB 3-0 SH 8-18 (SUTURE) ×3 IMPLANT
SUTURE FIBERWR #2 38 T-5 BLUE (SUTURE) ×4 IMPLANT
SYR CONTROL 10ML LL (SYRINGE) ×3 IMPLANT
TOWEL GREEN STERILE (TOWEL DISPOSABLE) ×3 IMPLANT
TOWEL GREEN STERILE FF (TOWEL DISPOSABLE) ×6 IMPLANT
TRAY FOLEY MTR SLVR 16FR STAT (SET/KITS/TRAYS/PACK) IMPLANT
WASHER 3.5MM (Orthopedic Implant) ×3 IMPLANT
WATER STERILE IRR 1000ML POUR (IV SOLUTION) ×3 IMPLANT
WIRE 18GA COCR 1PK (WIRE) IMPLANT

## 2021-08-15 NOTE — Anesthesia Procedure Notes (Signed)
Procedure Name: Intubation Date/Time: 08/15/2021 12:55 PM Performed by: Lovie Chol, CRNA Pre-anesthesia Checklist: Patient identified, Emergency Drugs available, Suction available and Patient being monitored Patient Re-evaluated:Patient Re-evaluated prior to induction Oxygen Delivery Method: Circle System Utilized Preoxygenation: Pre-oxygenation with 100% oxygen Induction Type: IV induction Ventilation: Mask ventilation without difficulty Laryngoscope Size: Miller and 3 Grade View: Grade I Tube type: Oral Tube size: 7.5 mm Number of attempts: 1 Airway Equipment and Method: Stylet and Oral airway Placement Confirmation: ETT inserted through vocal cords under direct vision, positive ETCO2 and breath sounds checked- equal and bilateral Secured at: 21 cm Tube secured with: Tape Dental Injury: Teeth and Oropharynx as per pre-operative assessment

## 2021-08-15 NOTE — Interval H&P Note (Signed)
Patient seen in preop area. Given bilateral injuries, will assist Dr. Everardo Pacific with surgery to reduce anesthesia time and allow both surgeries to be done in a single OR visit. Will plan for ORIF of the left ulna fracture. Patient neurovascularly intact distally in the left hand.

## 2021-08-15 NOTE — Anesthesia Procedure Notes (Signed)
Anesthesia Regional Block: Supraclavicular block   Pre-Anesthetic Checklist: , timeout performed,  Correct Patient, Correct Site, Correct Laterality,  Correct Procedure, Correct Position, site marked,  Risks and benefits discussed,  Surgical consent,  Pre-op evaluation,  At surgeon's request and post-op pain management  Laterality: Right and Lower  Prep: chloraprep       Needles:  Injection technique: Single-shot      Needle Length: 5cm  Needle Gauge: 22     Additional Needles: Arrow StimuQuik ECHO Echogenic Stimulating PNB Needle  Procedures:,,,, ultrasound used (permanent image in chart),,    Narrative:  Start time: 08/15/2021 12:14 PM End time: 08/15/2021 12:20 PM Injection made incrementally with aspirations every 5 mL.  Performed by: Personally  Anesthesiologist: Val Eagle, MD

## 2021-08-15 NOTE — Transfer of Care (Signed)
Immediate Anesthesia Transfer of Care Note  Patient: David Gates  Procedure(s) Performed: OPEN REDUCTION INTERNAL FIXATION (ORIF) DISTAL HUMERUS FRACTURE (Right) OPEN REDUCTION INTERNAL FIXATION (ORIF) ELBOW/OLECRANON FRACTURE (Left: Elbow)  Patient Location: PACU  Anesthesia Type:General and Regional  Level of Consciousness: oriented, drowsy and patient cooperative  Airway & Oxygen Therapy: Patient Spontanous Breathing and Patient connected to nasal cannula oxygen  Post-op Assessment: Report given to RN and Post -op Vital signs reviewed and stable  Post vital signs: Reviewed  Last Vitals:  Vitals Value Taken Time  BP    Temp    Pulse    Resp    SpO2      Last Pain:  Vitals:   08/15/21 1005  TempSrc: Oral  PainSc:          Complications: No notable events documented.

## 2021-08-15 NOTE — Anesthesia Preprocedure Evaluation (Addendum)
Anesthesia Evaluation  Patient identified by MRN, date of birth, ID band Patient awake    Reviewed: Allergy & Precautions, H&P , NPO status , Patient's Chart, lab work & pertinent test results  History of Anesthesia Complications Negative for: history of anesthetic complications  Airway Mallampati: I  TM Distance: >3 FB Neck ROM: Full    Dental  (+) Dental Advisory Given, Teeth Intact   Pulmonary neg pulmonary ROS,    breath sounds clear to auscultation       Cardiovascular negative cardio ROS   Rhythm:Regular     Neuro/Psych negative neurological ROS  negative psych ROS   GI/Hepatic negative GI ROS, Neg liver ROS,   Endo/Other  negative endocrine ROS  Renal/GU negative Renal ROS     Musculoskeletal OPEN REDUCTION INTERNAL FIXATION (ORIF) DISTAL HUMERUS FRACTURE (Right)   OPEN REDUCTION INTERNAL FIXATION (ORIF) ELBOW/OLECRANON FRACTURE (Left: Elbow)   Abdominal   Peds  Hematology negative hematology ROS (+)   Anesthesia Other Findings   Reproductive/Obstetrics                             Anesthesia Physical Anesthesia Plan  ASA: 1  Anesthesia Plan: General   Post-op Pain Management:    Induction: Intravenous  PONV Risk Score and Plan: 2 and Ondansetron and Dexamethasone  Airway Management Planned: Oral ETT  Additional Equipment: None  Intra-op Plan:   Post-operative Plan: Extubation in OR  Informed Consent: I have reviewed the patients History and Physical, chart, labs and discussed the procedure including the risks, benefits and alternatives for the proposed anesthesia with the patient or authorized representative who has indicated his/her understanding and acceptance.     Dental advisory given  Plan Discussed with: CRNA and Anesthesiologist  Anesthesia Plan Comments:         Anesthesia Quick Evaluation

## 2021-08-15 NOTE — Interval H&P Note (Signed)
All questions answered. Dr. Blanchie Dessert will help me with this case due to the complex nature of the injury.

## 2021-08-15 NOTE — Anesthesia Procedure Notes (Signed)
Central Venous Catheter Insertion Performed by: Val Eagle, MD, anesthesiologist Start/End11/18/2022 11:26 AM, 08/15/2021 11:32 AM Patient location: Pre-op. Preanesthetic checklist: patient identified, IV checked, site marked, risks and benefits discussed, surgical consent, monitors and equipment checked, pre-op evaluation, timeout performed and anesthesia consent Position: Trendelenburg Lidocaine 1% used for infiltration Hand hygiene performed  and maximum sterile barriers used  Catheter size: 8 Fr Total catheter length 16. Central line was placed.Double lumen Procedure performed using ultrasound guided technique. Ultrasound Notes:anatomy identified, needle tip was noted to be adjacent to the nerve/plexus identified, no ultrasound evidence of intravascular and/or intraneural injection and image(s) printed for medical record Attempts: 1 Following insertion, dressing applied, line sutured and Biopatch. Post procedure assessment: blood return through all ports, free fluid flow and no air  Patient tolerated the procedure well with no immediate complications.

## 2021-08-15 NOTE — Discharge Instructions (Addendum)
Ramond Marrow MD, MPH Alfonse Alpers, PA-C [facility] [address] [phone] (tel)   [phone] (fax)   POST-OPERATIVE INSTRUCTIONS   WOUND CARE - RIGHT ARM Please keep splint clean dry and intact until followup.  You may shower on Post-Op Day #3.  You must keep splint dry during this process and may find that a plastic bag taped around the extremity or alternatively a towel based bath may be a better option.   If you get your splint wet or if it is damaged please contact our clinic.  EXERCISES - RIGHT ARM Due to your splint being in place you will not be able to bear weight through your extremity.    You may use a sling for comfort Please continue to work on range of motion of your fingers and stretch these multiple times a day to prevent stiffness. Please continue to ambulate and do not stay sitting or lying for too long. Perform foot and wrist pumps to assist in circulation.  WOUND CARE - LEFT ARM You may remove the Operative Dressing on Post-Op Day #3 (72hrs after surgery).   Alternatively if you would like you can leave dressing on until follow-up if within 7-8 days but keep it dry. Leave steri-strips in place until they fall off on their own, usually 2 weeks postop. You may change/reinforce the bandage as needed.  Use the Cryocuff or Ice as often as possible for the first 7 days, then as needed for pain relief. Always keep a towel, ACE wrap or other barrier between the cooling unit and your skin.  You may shower on Post-Op Day #3. Gently pat the area dry. Do not soak the arm in water or submerge it. Keep incisions as dry as possible. Do not go swimming in the pool or ocean until 4 weeks after surgery or when otherwise instructed.    EXERCISES/BRACING - LEFT ARM You may use a sling for comfort but this is not required Do not lift anything heavier than your cell phone with your operative arm You may bend and straighten your left elbow as  you feel comfortable  FOLLOW-UP If you develop a Fever (>101.5), Redness or Drainage from the surgical incision site, please call our office to arrange for an evaluation. Please call the office to schedule a follow-up appointment for your incision check if you do not already have one, 7-10 days post-operatively.  REGIONAL ANESTHESIA (NERVE BLOCKS) The anesthesia team may have performed a nerve block for you if safe in the setting of your care.  This is a great tool used to minimize pain.  Typically the block may start wearing off overnight but the long acting medicine may last for 3-4 days.  The nerve block wearing off can be a challenging period but please utilize your as needed pain medications to try and manage this period.    POST-OP MEDICATIONS- Multimodal approach to pain control  In general your pain will be controlled with a combination of substances.  Prescriptions unless otherwise discussed are electronically sent to your pharmacy.  This is a carefully made plan we use to minimize narcotic use.     - These were sent in prior to surgery on Tuesday   - Celebrex - Anti-inflammatory medication taken on a scheduled basis    - Take 1 tablet twice a day  - Acetaminophen - Non-narcotic pain medicine taken on a scheduled basis     - Take two 500 mg tablets (1,000 mg  total) every 8 hours  - Oxycodone - This is a strong narcotic, to be used only on an "as needed" basis for SEVERE pain.             -           Zofran - take as needed for nausea   HELPFUL INFORMATION   If you had a block, it will wear off between 8-24 hrs postop typically.  This is period when your pain may go from nearly zero to the pain you would have had postop without the block.  This is an abrupt transition but nothing dangerous is happening.  You may take an extra dose of narcotic when this happens.   You may be more comfortable sleeping in a semi-seated position the first few nights following surgery.  Keep a pillow  propped under the elbow and forearm for comfort.  If you have a recliner type of chair it might be beneficial.  If not that is fine too, but it would be helpful to sleep propped up with pillows behind your operated shoulder as well under your elbow and forearm.  This will reduce pulling on the suture lines.   When dressing, put your operative arm in the sleeve first.  When getting undressed, take your operative arm out last.  Loose fitting, button-down shirts are recommended.  Often in the first days after surgery you may be more comfortable keeping your operative arm under your shirt and not through the sleeve.   You may return to work/school in the next couple of days when you feel up to it.  Desk work and typing in the sling is fine.   We suggest you use the pain medication the first night prior to going to bed, in order to ease any pain when the anesthesia wears off. You should avoid taking pain medications on an empty stomach as it will make you nauseous.   You should wean off your narcotic medicines as soon as you are able.  Most patients will be off or using minimal narcotics before their first postop appointment.    Do not drink alcoholic beverages or take illicit drugs when taking pain medications.   It is against the law to drive while taking narcotics.  In some states it is against the law to drive while your arm is in a sling.    Pain medication may make you constipated.  Below are a few solutions to try in this order:   - Decrease the amount of pain medication if you aren't having pain.   - Drink lots of decaffeinated fluids.   - Drink prune juice and/or each dried prunes   If the first 3 don't work start with additional solutions   - Take Colace - an over-the-counter stool softener   - Take Senokot - an over-the-counter laxative   - Take Miralax - a stronger over-the-counter laxative   For more information including helpful videos and documents visit our website:    [email or web address]

## 2021-08-15 NOTE — Op Note (Signed)
Orthopaedic Surgery Operative Note (CSN: [id number])  David Gates  1956/04/23 Date of Surgery: 08/15/2021   Diagnoses:  Bilateral elbow fractures: Right terrible triad injury with elbow instability and left comminuted olecranon fracture  Procedure: Open treatment right elbow dislocation 24586 modifier 22 Right elbow open reduction internal fixation of radial neck fracture 24665 Right elbow repair lateral ligament elbow 24343 Open treatment ulna fracture left elbow 19379    Operative Finding Successful completion of the planned procedure.  This was a much more difficult case than is typical as the patient had bilateral upper extremity injuries.  Due to this we needed to surgeons due to the complex nature of the fracture.  Dr. Blanchie Dessert started the left elbow while I completed the right.  David Gates was also necessary in order to assist with both sides.  Right elbow: Fixation of the radial head was relatively good however it was a comminuted fracture.  Our periarticular locking plate had good fixation.  Coronoid was not necessary to fixate as the elbow was stable once the lateral ligaments were repaired.  The dislocation was quite unstable otherwise.  Left elbow: Bone quality was good and even with the intercalary comminution we felt that fixation was robust enough to allow early motion secondary to the patient having otherwise to arms and splint.  Post-operative plan: The patient will be nonweightbearing in a splint on the right upper extremity for about a week transition to a hinged brace unlocked.  Less than 1 pound weight limit on the left elbow for ADLs.  The patient will be discharged home.  DVT prophylaxis not indicated in this ambulatory upper extremity patient without significant risk factors.   Pain control with PRN pain medication preferring oral medicines.  Follow up plan will be scheduled in approximately 7 days for incision check and XR.  Post-Op Diagnosis:  Same Surgeons:Primary: Bjorn Pippin, MD, co-surgeon Weber Cooks Assistant: David Alpers PA-C Location: [facility] OR ROOM 06 Anesthesia: General with regional anesthesia on the right elbow and local anesthetic on the left Antibiotics: Ancef 2 g with local vancomycin powder 1 g at the surgical site on each side Tourniquet time:  Total Tourniquet Time Documented: Upper Arm (laterality) - 68 minutes Upper Arm (laterality) - 89 minutes Total: Upper Arm (laterality) - 157 minutes  Estimated Blood Loss: Minimal Complications: None Specimens: None Implants: Implant Name Type Inv. Item Serial No. Manufacturer Lot No. LRB No. Used Action  ANCHOR JUGGERKNOT W/DRL 2/1.45 - [id number] Anchor ANCHOR JUGGERKNOT W/DRL 2/1.45  [employer](ORTH,TRAU,BIO,SG) [id number] Right 1 Implanted  SCREW PEG 2.5X20 NONLOCK - [id number] Screw SCREW PEG 2.5X20 NONLOCK  [employer](ORTH,TRAU,BIO,SG)  Right 1 Implanted  SCREW PEG LOCK 2.5X10 - [id number] Peg SCREW PEG LOCK 2.5X10  [employer](ORTH,TRAU,BIO,SG)  Right 1 Implanted  SCREW PEG LOCK 2.5X14 - [id number] Peg SCREW PEG LOCK 2.5X14  [employer](ORTH,TRAU,BIO,SG)  Right 1 Implanted  SCREW PEG LOCK 2.5X18 - [id number] Peg SCREW PEG LOCK 2.5X18  [employer](ORTH,TRAU,BIO,SG)  Right 2 Implanted  SCREW PEG LOCK 2.5X20 - [id number] Peg SCREW PEG LOCK 2.5X20  [employer](ORTH,TRAU,BIO,SG)  Right 1 Implanted  SCREW PEG 2.5X18 NONLOCK - [id number] Screw SCREW PEG 2.5X18 NONLOCK  [employer](ORTH,TRAU,BIO,SG)  Right 1 Implanted  PLATE PROX RAD LRG - [id number] Plate PLATE PROX RAD LRG  [employer](ORTH,TRAU,BIO,SG)  Right 1 Implanted  PLATE OLECRANON SM - [id number] Plate PLATE OLECRANON SM  [employer](ORTH,TRAU,BIO,SG)  Left 1 Implanted  SCREW LOCK CORT STAR 3.5X46 - [id number] Screw SCREW LOCK CORT STAR 3.5X46  [employer](ORTH,TRAU,BIO,SG)  Left 1 Implanted  SCREW LOCK CORT STAR 3.5X16 - [id number] Screw SCREW LOCK CORT STAR 3.5X16  [employer](ORTH,TRAU,BIO,SG)  Left  1 Implanted  SCREW LOCK CORT STAR 3.5X12 - [id number] Screw SCREW LOCK CORT STAR 3.5X12  [employer](ORTH,TRAU,BIO,SG)  Left 1 Implanted  SCREW LOCK CORT STAR 3.5X20 - [id number] Screw SCREW LOCK CORT STAR 3.5X20  [employer](ORTH,TRAU,BIO,SG)  Left 2 Implanted  SCREW LP NL T15 3.5X24 - [id number] Screw SCREW LP NL T15 3.5X24  [employer](ORTH,TRAU,BIO,SG)  Left 1 Implanted  SCREW LOW PROF TIS 3.5X28MM - [id number] Screw SCREW LOW PROF TIS 3.5X28MM  [employer](ORTH,TRAU,BIO,SG)  Left 1 Implanted  WASHER 3.5MM - [id number] Orthopedic Implant WASHER 3.5MM  [employer](ORTH,TRAU,BIO,SG)  Left 1 Implanted    Indications for Surgery:   David Gates is a 65 y.o. male with fall resulting in complex elbow fractures with a dislocation associated on the right.  Benefits and risks of operative and nonoperative management were discussed prior to surgery with patient/guardian(s) and informed consent form was completed.  Specific risks including infection, need for additional surgery, nonunion, malunion, stiffness, heterotopic bone and neurovascular damage amongst others   Procedure:   The patient was identified properly. Informed consent was obtained and the surgical site was marked. The patient was taken up to suite where general anesthesia was induced.  The patient was positioned supine with 2 hand table.  The bilateral elbow were prepped and draped in the usual sterile fashion.  Timeout was performed before the beginning of the case.  Tourniquet was used for the above duration.  Beginning with the right side we identified Coker general wound and longitudinal incision obtaining hemostasis we progressed.  We identified the Kocher interval and open the sharply extending our approach up to the lateral aspect of the distal humerus.  The lateral ligaments were partially avulsed.  We noted that there was a comminuted fracture of the radial neck however the radial head was intact.  We able to obtain provisional  reduction using a series of K wires checking our fluoroscopic reduction prior to proceeding.  We are happy with our overall initial reduction and flow we initially attempted to use a radial head plate this was not long enough to obtain distal fixation.  We transition to a radial neck plate from [employer] system.  Using this plate we are able to obtain provisional reduction with K wires and initially used nonlocking screws proximal and distal.  At that point were able to use a series of nonlocking and locking screws to obtain fixation of the periarticular fracture.  We checked to ensure that our screws did not enter the joint and were able to visualize the radial head as well as check range of motion to ensure that we had full range of motion staying with our plate in the safe zone on the radius.  Careful retractors were used throughout the case to avoid damage the posterior interosseous nerve.  Final x-rays of the right elbow demonstrate anatomic reduction with appropriate position of the implants.  We irrigated copiously.  No loose bodies were noted.  Open treatment of the elbow dislocation was performed by virtue of the ORIF of the radial head as well as the lateral ligament repair.  We decided that the coronoid was not necessary to be fixated as we provisionally put a single juggernaut anchor loaded with #2 suture into the center of rotation of the distal humerus.  We were able to use a running locking  Krakw suture to obtain fixation of the lateral ligaments and imbricate the tissue after we had closed the annular ligament with a separate layer.  We placed local vancomycin powder and closed in a multilayer fashion and turned our attention to the contralateral side.  Dr. Blanchie Dessert began with the approach on the lateral side going longitudinally over the subcutaneous border the ulna obtaining hemostasis as he progressed.  Afterwards we dissected through and identify the fracture fragments.  There was  an intercalary piece that was cleared of hematoma as well as the fracture site.  Provisional reduction was performed with a series of clamps and K wires obtaining appropriate fluoroscopic reduction.  After that a Biomet Alps plate was selected and we positioned the plate obtaining distal fixation using the slotted hole and checking that our plate position was appropriate on imaging.  Once this was appropriate we placed a homerun "" screw obtaining compression at the fracture site noting that we had a millimeters of diastases but overall near anatomic alignment.  We felt the trying to over compress the fracture site may lead to more issues than the diastasis itself.  We then proceeded to place our olecranon screws and used a series of locking and nonlocking screws to obtain appropriate and robust fixation.  Final fluoroscopic images demonstrated near anatomic alignment of the joint with a stable joint overall.  We irrigated copiously placing local vancomycin powder before performing a multilayer closure with absorbable suture.  Local anesthetic was infiltrated.  Sterile dressing was placed.  No splint was placed.  Patient was awoken taken to PACU in stable condition.  Due to the complex nature of this case 2 surgeons that were fellowship trained were necessary to perform timely fixation of the bilateral upper extremities.  Additionally, David Alpers, PA-C, present and scrubbed throughout the case, critical for completion in a timely fashion, and for retraction, instrumentation, closure.

## 2021-08-15 NOTE — Progress Notes (Signed)
No blood work needed per Dr. Maple Hudson.

## 2021-08-18 NOTE — Anesthesia Postprocedure Evaluation (Signed)
Anesthesia Post Note  Patient: David Gates  Procedure(s) Performed: OPEN REDUCTION INTERNAL FIXATION (ORIF) DISTAL HUMERUS FRACTURE (Right) OPEN REDUCTION INTERNAL FIXATION (ORIF) ELBOW/OLECRANON FRACTURE (Left: Elbow)     Patient location during evaluation: PACU Anesthesia Type: General and Regional Level of consciousness: awake and alert Pain management: pain level controlled Vital Signs Assessment: post-procedure vital signs reviewed and stable Respiratory status: spontaneous breathing, nonlabored ventilation, respiratory function stable and patient connected to nasal cannula oxygen Cardiovascular status: blood pressure returned to baseline and stable Postop Assessment: no apparent nausea or vomiting Anesthetic complications: no   No notable events documented.  Last Vitals:  Vitals:   08/15/21 1630 08/15/21 1641  BP:  121/86  Pulse: (!) 50 (!) 58  Resp: 12 16  Temp:    SpO2: 96% 98%    Last Pain:  Vitals:   08/15/21 1541  TempSrc:   PainSc: 6                  Emeril Stille

## 2021-08-19 ENCOUNTER — Encounter (HOSPITAL_COMMUNITY): Payer: Self-pay | Admitting: Orthopaedic Surgery

## 2021-08-19 DIAGNOSIS — M25521 Pain in right elbow: Secondary | ICD-10-CM | POA: Diagnosis not present

## 2021-08-19 DIAGNOSIS — M25522 Pain in left elbow: Secondary | ICD-10-CM | POA: Diagnosis not present

## 2021-08-29 DIAGNOSIS — M2560 Stiffness of unspecified joint, not elsewhere classified: Secondary | ICD-10-CM | POA: Diagnosis not present

## 2021-08-29 DIAGNOSIS — M25521 Pain in right elbow: Secondary | ICD-10-CM | POA: Diagnosis not present

## 2021-08-29 DIAGNOSIS — M25522 Pain in left elbow: Secondary | ICD-10-CM | POA: Diagnosis not present

## 2021-08-29 DIAGNOSIS — M6281 Muscle weakness (generalized): Secondary | ICD-10-CM | POA: Diagnosis not present

## 2021-09-02 DIAGNOSIS — M25521 Pain in right elbow: Secondary | ICD-10-CM | POA: Diagnosis not present

## 2021-09-02 DIAGNOSIS — M6281 Muscle weakness (generalized): Secondary | ICD-10-CM | POA: Diagnosis not present

## 2021-09-02 DIAGNOSIS — M25522 Pain in left elbow: Secondary | ICD-10-CM | POA: Diagnosis not present

## 2021-09-02 DIAGNOSIS — M2560 Stiffness of unspecified joint, not elsewhere classified: Secondary | ICD-10-CM | POA: Diagnosis not present

## 2021-09-05 DIAGNOSIS — M25521 Pain in right elbow: Secondary | ICD-10-CM | POA: Diagnosis not present

## 2021-09-05 DIAGNOSIS — M25522 Pain in left elbow: Secondary | ICD-10-CM | POA: Diagnosis not present

## 2021-09-05 DIAGNOSIS — M2560 Stiffness of unspecified joint, not elsewhere classified: Secondary | ICD-10-CM | POA: Diagnosis not present

## 2021-09-05 DIAGNOSIS — M6281 Muscle weakness (generalized): Secondary | ICD-10-CM | POA: Diagnosis not present

## 2021-09-09 DIAGNOSIS — M25522 Pain in left elbow: Secondary | ICD-10-CM | POA: Diagnosis not present

## 2021-09-09 DIAGNOSIS — M25521 Pain in right elbow: Secondary | ICD-10-CM | POA: Diagnosis not present

## 2021-09-09 DIAGNOSIS — M6281 Muscle weakness (generalized): Secondary | ICD-10-CM | POA: Diagnosis not present

## 2021-09-09 DIAGNOSIS — M2560 Stiffness of unspecified joint, not elsewhere classified: Secondary | ICD-10-CM | POA: Diagnosis not present

## 2021-09-12 DIAGNOSIS — M6281 Muscle weakness (generalized): Secondary | ICD-10-CM | POA: Diagnosis not present

## 2021-09-12 DIAGNOSIS — M2560 Stiffness of unspecified joint, not elsewhere classified: Secondary | ICD-10-CM | POA: Diagnosis not present

## 2021-09-12 DIAGNOSIS — M25521 Pain in right elbow: Secondary | ICD-10-CM | POA: Diagnosis not present

## 2021-09-12 DIAGNOSIS — M25522 Pain in left elbow: Secondary | ICD-10-CM | POA: Diagnosis not present

## 2021-09-15 DIAGNOSIS — M25522 Pain in left elbow: Secondary | ICD-10-CM | POA: Diagnosis not present

## 2021-09-15 DIAGNOSIS — M25521 Pain in right elbow: Secondary | ICD-10-CM | POA: Diagnosis not present

## 2021-09-15 DIAGNOSIS — M2560 Stiffness of unspecified joint, not elsewhere classified: Secondary | ICD-10-CM | POA: Diagnosis not present

## 2021-09-15 DIAGNOSIS — M6281 Muscle weakness (generalized): Secondary | ICD-10-CM | POA: Diagnosis not present

## 2021-09-17 DIAGNOSIS — M2560 Stiffness of unspecified joint, not elsewhere classified: Secondary | ICD-10-CM | POA: Diagnosis not present

## 2021-09-17 DIAGNOSIS — M6281 Muscle weakness (generalized): Secondary | ICD-10-CM | POA: Diagnosis not present

## 2021-09-17 DIAGNOSIS — M25522 Pain in left elbow: Secondary | ICD-10-CM | POA: Diagnosis not present

## 2021-09-17 DIAGNOSIS — M25521 Pain in right elbow: Secondary | ICD-10-CM | POA: Diagnosis not present

## 2021-09-23 DIAGNOSIS — M2560 Stiffness of unspecified joint, not elsewhere classified: Secondary | ICD-10-CM | POA: Diagnosis not present

## 2021-09-23 DIAGNOSIS — M25522 Pain in left elbow: Secondary | ICD-10-CM | POA: Diagnosis not present

## 2021-09-23 DIAGNOSIS — M25521 Pain in right elbow: Secondary | ICD-10-CM | POA: Diagnosis not present

## 2021-09-23 DIAGNOSIS — M6281 Muscle weakness (generalized): Secondary | ICD-10-CM | POA: Diagnosis not present

## 2021-09-26 DIAGNOSIS — M6281 Muscle weakness (generalized): Secondary | ICD-10-CM | POA: Diagnosis not present

## 2021-09-26 DIAGNOSIS — M25522 Pain in left elbow: Secondary | ICD-10-CM | POA: Diagnosis not present

## 2021-09-26 DIAGNOSIS — M25521 Pain in right elbow: Secondary | ICD-10-CM | POA: Diagnosis not present

## 2021-09-26 DIAGNOSIS — M2560 Stiffness of unspecified joint, not elsewhere classified: Secondary | ICD-10-CM | POA: Diagnosis not present

## 2021-09-30 DIAGNOSIS — M2560 Stiffness of unspecified joint, not elsewhere classified: Secondary | ICD-10-CM | POA: Diagnosis not present

## 2021-09-30 DIAGNOSIS — M25522 Pain in left elbow: Secondary | ICD-10-CM | POA: Diagnosis not present

## 2021-09-30 DIAGNOSIS — M6281 Muscle weakness (generalized): Secondary | ICD-10-CM | POA: Diagnosis not present

## 2021-09-30 DIAGNOSIS — M25521 Pain in right elbow: Secondary | ICD-10-CM | POA: Diagnosis not present

## 2021-10-03 DIAGNOSIS — M25521 Pain in right elbow: Secondary | ICD-10-CM | POA: Diagnosis not present

## 2021-10-03 DIAGNOSIS — M2560 Stiffness of unspecified joint, not elsewhere classified: Secondary | ICD-10-CM | POA: Diagnosis not present

## 2021-10-03 DIAGNOSIS — M6281 Muscle weakness (generalized): Secondary | ICD-10-CM | POA: Diagnosis not present

## 2021-10-03 DIAGNOSIS — M25522 Pain in left elbow: Secondary | ICD-10-CM | POA: Diagnosis not present

## 2021-10-07 DIAGNOSIS — M2560 Stiffness of unspecified joint, not elsewhere classified: Secondary | ICD-10-CM | POA: Diagnosis not present

## 2021-10-07 DIAGNOSIS — M25521 Pain in right elbow: Secondary | ICD-10-CM | POA: Diagnosis not present

## 2021-10-07 DIAGNOSIS — M25522 Pain in left elbow: Secondary | ICD-10-CM | POA: Diagnosis not present

## 2021-10-07 DIAGNOSIS — M6281 Muscle weakness (generalized): Secondary | ICD-10-CM | POA: Diagnosis not present

## 2021-10-10 DIAGNOSIS — M2560 Stiffness of unspecified joint, not elsewhere classified: Secondary | ICD-10-CM | POA: Diagnosis not present

## 2021-10-10 DIAGNOSIS — M25521 Pain in right elbow: Secondary | ICD-10-CM | POA: Diagnosis not present

## 2021-10-10 DIAGNOSIS — M25522 Pain in left elbow: Secondary | ICD-10-CM | POA: Diagnosis not present

## 2021-10-10 DIAGNOSIS — M6281 Muscle weakness (generalized): Secondary | ICD-10-CM | POA: Diagnosis not present

## 2021-10-14 DIAGNOSIS — M25522 Pain in left elbow: Secondary | ICD-10-CM | POA: Diagnosis not present

## 2021-10-14 DIAGNOSIS — M2560 Stiffness of unspecified joint, not elsewhere classified: Secondary | ICD-10-CM | POA: Diagnosis not present

## 2021-10-14 DIAGNOSIS — M25521 Pain in right elbow: Secondary | ICD-10-CM | POA: Diagnosis not present

## 2021-10-14 DIAGNOSIS — M6281 Muscle weakness (generalized): Secondary | ICD-10-CM | POA: Diagnosis not present

## 2021-10-17 DIAGNOSIS — M2560 Stiffness of unspecified joint, not elsewhere classified: Secondary | ICD-10-CM | POA: Diagnosis not present

## 2021-10-17 DIAGNOSIS — M25521 Pain in right elbow: Secondary | ICD-10-CM | POA: Diagnosis not present

## 2021-10-17 DIAGNOSIS — M6281 Muscle weakness (generalized): Secondary | ICD-10-CM | POA: Diagnosis not present

## 2021-10-17 DIAGNOSIS — M25522 Pain in left elbow: Secondary | ICD-10-CM | POA: Diagnosis not present

## 2021-10-21 DIAGNOSIS — M6281 Muscle weakness (generalized): Secondary | ICD-10-CM | POA: Diagnosis not present

## 2021-10-21 DIAGNOSIS — M2560 Stiffness of unspecified joint, not elsewhere classified: Secondary | ICD-10-CM | POA: Diagnosis not present

## 2021-10-21 DIAGNOSIS — M25521 Pain in right elbow: Secondary | ICD-10-CM | POA: Diagnosis not present

## 2021-10-21 DIAGNOSIS — M25522 Pain in left elbow: Secondary | ICD-10-CM | POA: Diagnosis not present

## 2021-10-24 DIAGNOSIS — M2560 Stiffness of unspecified joint, not elsewhere classified: Secondary | ICD-10-CM | POA: Diagnosis not present

## 2021-10-24 DIAGNOSIS — M25521 Pain in right elbow: Secondary | ICD-10-CM | POA: Diagnosis not present

## 2021-10-24 DIAGNOSIS — M25522 Pain in left elbow: Secondary | ICD-10-CM | POA: Diagnosis not present

## 2021-10-24 DIAGNOSIS — M6281 Muscle weakness (generalized): Secondary | ICD-10-CM | POA: Diagnosis not present

## 2021-10-28 DIAGNOSIS — M6281 Muscle weakness (generalized): Secondary | ICD-10-CM | POA: Diagnosis not present

## 2021-10-28 DIAGNOSIS — M25522 Pain in left elbow: Secondary | ICD-10-CM | POA: Diagnosis not present

## 2021-10-28 DIAGNOSIS — M25521 Pain in right elbow: Secondary | ICD-10-CM | POA: Diagnosis not present

## 2021-10-28 DIAGNOSIS — M2560 Stiffness of unspecified joint, not elsewhere classified: Secondary | ICD-10-CM | POA: Diagnosis not present

## 2021-10-31 DIAGNOSIS — M25522 Pain in left elbow: Secondary | ICD-10-CM | POA: Diagnosis not present

## 2021-10-31 DIAGNOSIS — M6281 Muscle weakness (generalized): Secondary | ICD-10-CM | POA: Diagnosis not present

## 2021-10-31 DIAGNOSIS — M25521 Pain in right elbow: Secondary | ICD-10-CM | POA: Diagnosis not present

## 2021-10-31 DIAGNOSIS — M2560 Stiffness of unspecified joint, not elsewhere classified: Secondary | ICD-10-CM | POA: Diagnosis not present

## 2021-11-04 DIAGNOSIS — M25512 Pain in left shoulder: Secondary | ICD-10-CM | POA: Diagnosis not present

## 2021-11-04 DIAGNOSIS — M25521 Pain in right elbow: Secondary | ICD-10-CM | POA: Diagnosis not present

## 2021-11-04 DIAGNOSIS — M2560 Stiffness of unspecified joint, not elsewhere classified: Secondary | ICD-10-CM | POA: Diagnosis not present

## 2021-11-04 DIAGNOSIS — M6281 Muscle weakness (generalized): Secondary | ICD-10-CM | POA: Diagnosis not present

## 2021-11-04 DIAGNOSIS — M25522 Pain in left elbow: Secondary | ICD-10-CM | POA: Diagnosis not present

## 2022-01-07 DIAGNOSIS — G8918 Other acute postprocedural pain: Secondary | ICD-10-CM | POA: Diagnosis not present

## 2022-01-07 DIAGNOSIS — M7522 Bicipital tendinitis, left shoulder: Secondary | ICD-10-CM | POA: Diagnosis not present

## 2022-01-07 DIAGNOSIS — S46812A Strain of other muscles, fascia and tendons at shoulder and upper arm level, left arm, initial encounter: Secondary | ICD-10-CM | POA: Diagnosis not present

## 2022-01-07 DIAGNOSIS — M7542 Impingement syndrome of left shoulder: Secondary | ICD-10-CM | POA: Diagnosis not present

## 2022-01-07 DIAGNOSIS — M24112 Other articular cartilage disorders, left shoulder: Secondary | ICD-10-CM | POA: Diagnosis not present

## 2022-01-07 DIAGNOSIS — Y999 Unspecified external cause status: Secondary | ICD-10-CM | POA: Diagnosis not present

## 2022-01-07 DIAGNOSIS — S46212A Strain of muscle, fascia and tendon of other parts of biceps, left arm, initial encounter: Secondary | ICD-10-CM | POA: Diagnosis not present

## 2022-01-07 DIAGNOSIS — X58XXXA Exposure to other specified factors, initial encounter: Secondary | ICD-10-CM | POA: Diagnosis not present

## 2022-01-07 DIAGNOSIS — M7552 Bursitis of left shoulder: Secondary | ICD-10-CM | POA: Diagnosis not present

## 2022-01-07 DIAGNOSIS — S46012A Strain of muscle(s) and tendon(s) of the rotator cuff of left shoulder, initial encounter: Secondary | ICD-10-CM | POA: Diagnosis not present

## 2022-01-15 DIAGNOSIS — M24112 Other articular cartilage disorders, left shoulder: Secondary | ICD-10-CM | POA: Diagnosis not present

## 2022-02-16 DIAGNOSIS — M25512 Pain in left shoulder: Secondary | ICD-10-CM | POA: Diagnosis not present

## 2022-02-16 DIAGNOSIS — M6281 Muscle weakness (generalized): Secondary | ICD-10-CM | POA: Diagnosis not present

## 2022-02-19 DIAGNOSIS — M25512 Pain in left shoulder: Secondary | ICD-10-CM | POA: Diagnosis not present

## 2022-02-19 DIAGNOSIS — M6281 Muscle weakness (generalized): Secondary | ICD-10-CM | POA: Diagnosis not present

## 2022-02-24 DIAGNOSIS — M6281 Muscle weakness (generalized): Secondary | ICD-10-CM | POA: Diagnosis not present

## 2022-02-24 DIAGNOSIS — M25512 Pain in left shoulder: Secondary | ICD-10-CM | POA: Diagnosis not present

## 2022-02-25 DIAGNOSIS — R1084 Generalized abdominal pain: Secondary | ICD-10-CM | POA: Diagnosis not present

## 2022-02-25 DIAGNOSIS — R1915 Other abnormal bowel sounds: Secondary | ICD-10-CM | POA: Diagnosis not present

## 2022-02-25 DIAGNOSIS — Z6822 Body mass index (BMI) 22.0-22.9, adult: Secondary | ICD-10-CM | POA: Diagnosis not present

## 2022-02-25 DIAGNOSIS — R140 Abdominal distension (gaseous): Secondary | ICD-10-CM | POA: Diagnosis not present

## 2022-02-27 DIAGNOSIS — M25512 Pain in left shoulder: Secondary | ICD-10-CM | POA: Diagnosis not present

## 2022-02-27 DIAGNOSIS — M6281 Muscle weakness (generalized): Secondary | ICD-10-CM | POA: Diagnosis not present

## 2022-03-03 DIAGNOSIS — M6281 Muscle weakness (generalized): Secondary | ICD-10-CM | POA: Diagnosis not present

## 2022-03-03 DIAGNOSIS — M25512 Pain in left shoulder: Secondary | ICD-10-CM | POA: Diagnosis not present

## 2022-03-06 DIAGNOSIS — M25512 Pain in left shoulder: Secondary | ICD-10-CM | POA: Diagnosis not present

## 2022-03-06 DIAGNOSIS — M6281 Muscle weakness (generalized): Secondary | ICD-10-CM | POA: Diagnosis not present

## 2022-03-09 DIAGNOSIS — M25512 Pain in left shoulder: Secondary | ICD-10-CM | POA: Diagnosis not present

## 2022-03-09 DIAGNOSIS — M6281 Muscle weakness (generalized): Secondary | ICD-10-CM | POA: Diagnosis not present

## 2022-03-12 DIAGNOSIS — M25512 Pain in left shoulder: Secondary | ICD-10-CM | POA: Diagnosis not present

## 2022-03-12 DIAGNOSIS — M6281 Muscle weakness (generalized): Secondary | ICD-10-CM | POA: Diagnosis not present

## 2022-03-17 DIAGNOSIS — M25512 Pain in left shoulder: Secondary | ICD-10-CM | POA: Diagnosis not present

## 2022-03-17 DIAGNOSIS — M6281 Muscle weakness (generalized): Secondary | ICD-10-CM | POA: Diagnosis not present

## 2022-03-20 DIAGNOSIS — M25512 Pain in left shoulder: Secondary | ICD-10-CM | POA: Diagnosis not present

## 2022-03-20 DIAGNOSIS — M6281 Muscle weakness (generalized): Secondary | ICD-10-CM | POA: Diagnosis not present

## 2022-03-23 DIAGNOSIS — M6281 Muscle weakness (generalized): Secondary | ICD-10-CM | POA: Diagnosis not present

## 2022-03-23 DIAGNOSIS — M25512 Pain in left shoulder: Secondary | ICD-10-CM | POA: Diagnosis not present

## 2022-03-26 DIAGNOSIS — M6281 Muscle weakness (generalized): Secondary | ICD-10-CM | POA: Diagnosis not present

## 2022-03-26 DIAGNOSIS — M25512 Pain in left shoulder: Secondary | ICD-10-CM | POA: Diagnosis not present

## 2022-03-30 DIAGNOSIS — M25512 Pain in left shoulder: Secondary | ICD-10-CM | POA: Diagnosis not present

## 2022-03-30 DIAGNOSIS — M6281 Muscle weakness (generalized): Secondary | ICD-10-CM | POA: Diagnosis not present

## 2022-04-02 DIAGNOSIS — M6281 Muscle weakness (generalized): Secondary | ICD-10-CM | POA: Diagnosis not present

## 2022-04-02 DIAGNOSIS — M25512 Pain in left shoulder: Secondary | ICD-10-CM | POA: Diagnosis not present

## 2022-04-03 DIAGNOSIS — M24112 Other articular cartilage disorders, left shoulder: Secondary | ICD-10-CM | POA: Diagnosis not present

## 2022-04-06 DIAGNOSIS — M25512 Pain in left shoulder: Secondary | ICD-10-CM | POA: Diagnosis not present

## 2022-04-06 DIAGNOSIS — M6281 Muscle weakness (generalized): Secondary | ICD-10-CM | POA: Diagnosis not present

## 2022-04-09 DIAGNOSIS — M6281 Muscle weakness (generalized): Secondary | ICD-10-CM | POA: Diagnosis not present

## 2022-04-09 DIAGNOSIS — M25512 Pain in left shoulder: Secondary | ICD-10-CM | POA: Diagnosis not present

## 2022-04-13 DIAGNOSIS — M6281 Muscle weakness (generalized): Secondary | ICD-10-CM | POA: Diagnosis not present

## 2022-04-13 DIAGNOSIS — M25512 Pain in left shoulder: Secondary | ICD-10-CM | POA: Diagnosis not present

## 2022-04-16 DIAGNOSIS — M25512 Pain in left shoulder: Secondary | ICD-10-CM | POA: Diagnosis not present

## 2022-04-16 DIAGNOSIS — M6281 Muscle weakness (generalized): Secondary | ICD-10-CM | POA: Diagnosis not present

## 2022-04-20 DIAGNOSIS — M6281 Muscle weakness (generalized): Secondary | ICD-10-CM | POA: Diagnosis not present

## 2022-04-20 DIAGNOSIS — M25512 Pain in left shoulder: Secondary | ICD-10-CM | POA: Diagnosis not present

## 2022-04-23 DIAGNOSIS — M25512 Pain in left shoulder: Secondary | ICD-10-CM | POA: Diagnosis not present

## 2022-04-23 DIAGNOSIS — M6281 Muscle weakness (generalized): Secondary | ICD-10-CM | POA: Diagnosis not present

## 2022-04-28 DIAGNOSIS — M25512 Pain in left shoulder: Secondary | ICD-10-CM | POA: Diagnosis not present

## 2022-04-28 DIAGNOSIS — M6281 Muscle weakness (generalized): Secondary | ICD-10-CM | POA: Diagnosis not present

## 2022-05-05 DIAGNOSIS — M6281 Muscle weakness (generalized): Secondary | ICD-10-CM | POA: Diagnosis not present

## 2022-05-05 DIAGNOSIS — M25512 Pain in left shoulder: Secondary | ICD-10-CM | POA: Diagnosis not present

## 2022-05-11 DIAGNOSIS — M6281 Muscle weakness (generalized): Secondary | ICD-10-CM | POA: Diagnosis not present

## 2022-05-11 DIAGNOSIS — M25512 Pain in left shoulder: Secondary | ICD-10-CM | POA: Diagnosis not present

## 2022-05-15 DIAGNOSIS — M24112 Other articular cartilage disorders, left shoulder: Secondary | ICD-10-CM | POA: Diagnosis not present

## 2022-07-15 DIAGNOSIS — Z131 Encounter for screening for diabetes mellitus: Secondary | ICD-10-CM | POA: Diagnosis not present

## 2022-07-15 DIAGNOSIS — Z139 Encounter for screening, unspecified: Secondary | ICD-10-CM | POA: Diagnosis not present

## 2022-07-15 DIAGNOSIS — Z136 Encounter for screening for cardiovascular disorders: Secondary | ICD-10-CM | POA: Diagnosis not present

## 2022-07-15 DIAGNOSIS — Z125 Encounter for screening for malignant neoplasm of prostate: Secondary | ICD-10-CM | POA: Diagnosis not present

## 2022-07-15 DIAGNOSIS — R7989 Other specified abnormal findings of blood chemistry: Secondary | ICD-10-CM | POA: Diagnosis not present

## 2022-07-15 DIAGNOSIS — Z0000 Encounter for general adult medical examination without abnormal findings: Secondary | ICD-10-CM | POA: Diagnosis not present

## 2022-07-15 DIAGNOSIS — Z1389 Encounter for screening for other disorder: Secondary | ICD-10-CM | POA: Diagnosis not present

## 2022-07-15 DIAGNOSIS — Z13220 Encounter for screening for lipoid disorders: Secondary | ICD-10-CM | POA: Diagnosis not present

## 2022-07-15 DIAGNOSIS — Z6822 Body mass index (BMI) 22.0-22.9, adult: Secondary | ICD-10-CM | POA: Diagnosis not present

## 2022-07-15 DIAGNOSIS — Z1331 Encounter for screening for depression: Secondary | ICD-10-CM | POA: Diagnosis not present

## 2022-07-15 DIAGNOSIS — Z720 Tobacco use: Secondary | ICD-10-CM | POA: Diagnosis not present

## 2022-07-15 DIAGNOSIS — Z1339 Encounter for screening examination for other mental health and behavioral disorders: Secondary | ICD-10-CM | POA: Diagnosis not present

## 2023-04-14 IMAGING — DX DG ELBOW 2V*L*
2 series · 2 of 2 positions shown · non-contrast
Comparison: 08/11/2021

CLINICAL DATA: Postop check.

EXAM:
LEFT ELBOW - 2 VIEW

[elbow ap]
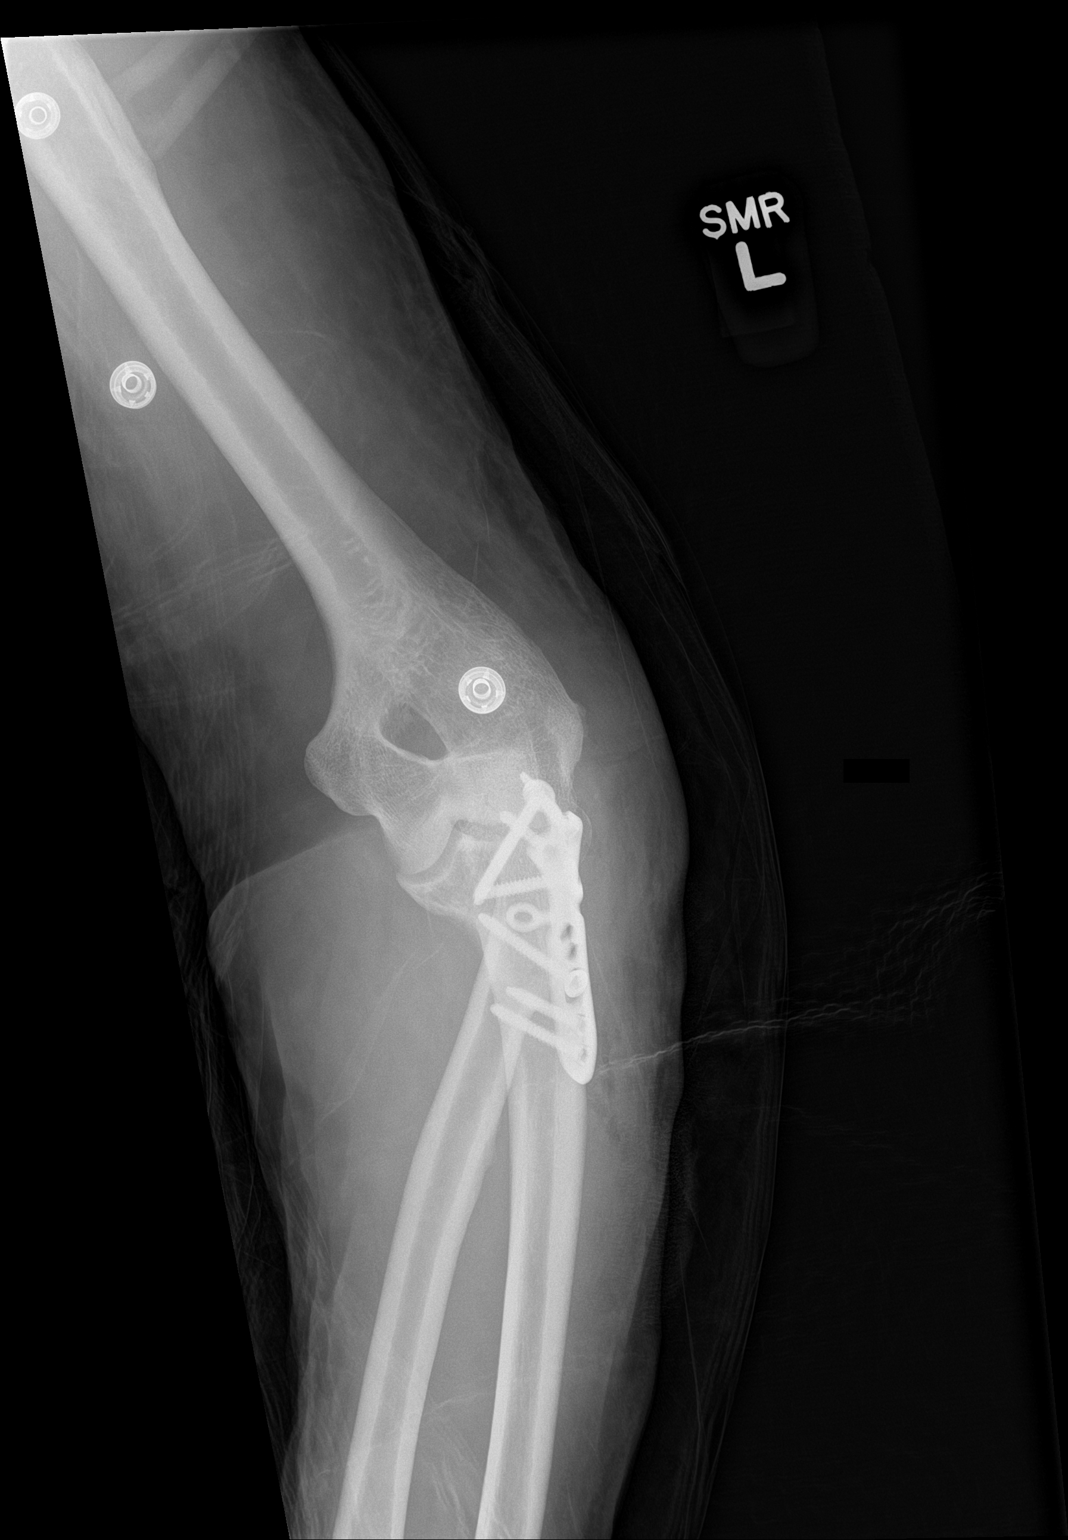

[elbow lat]
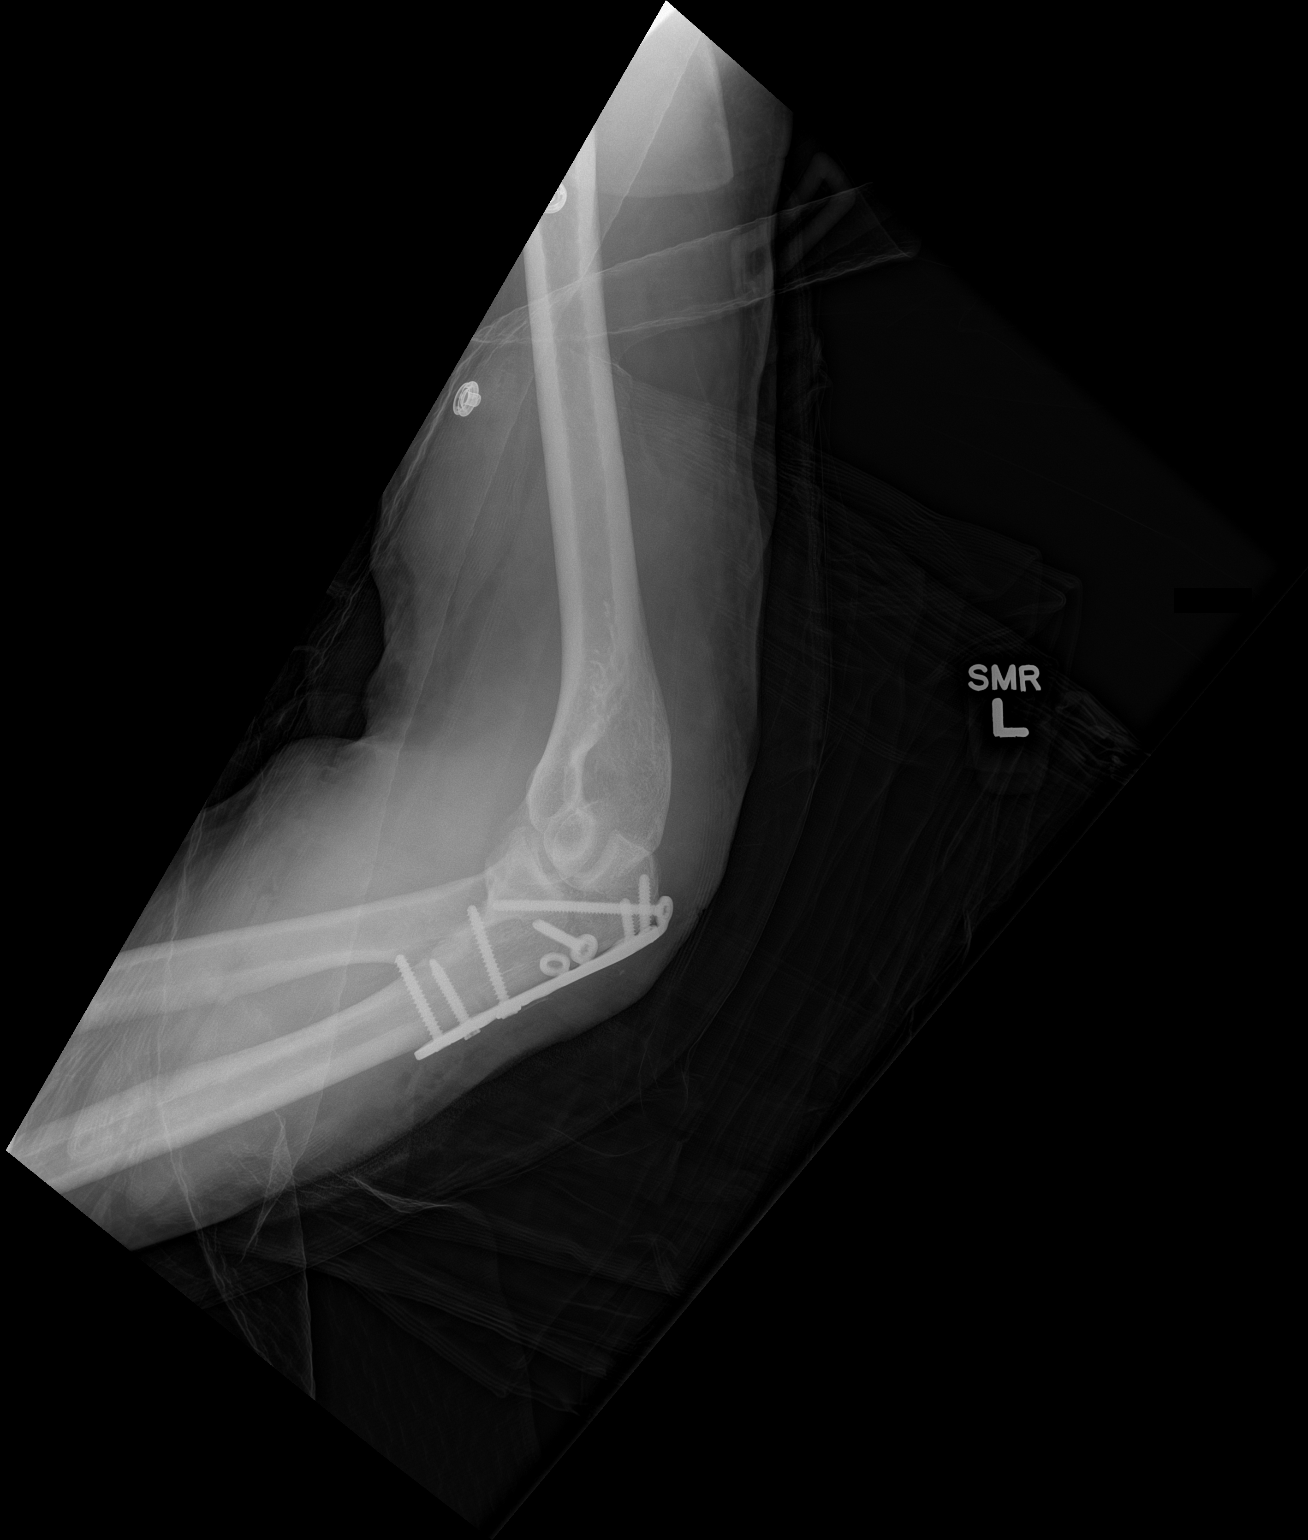

[2 of 2 positions shown; findings below may reference images not displayed]

FINDINGS: Two views of the left elbow demonstrate plate and screw fixation of
the olecranon. Elbow appears located on these two views. Near
anatomic alignment of the proximal ulna. Limited evaluation of the
proximal radius.
IMPRESSION: Internal fixation of the comminuted left olecranon fracture.

## 2023-04-14 IMAGING — DX DG ELBOW 2V*R*
2 series · 2 of 2 positions shown · non-contrast
Comparison: 08/11/2021

CLINICAL DATA: Fracture proximal right radius

EXAM:
RIGHT ELBOW - 2 VIEW

[elbow ap]
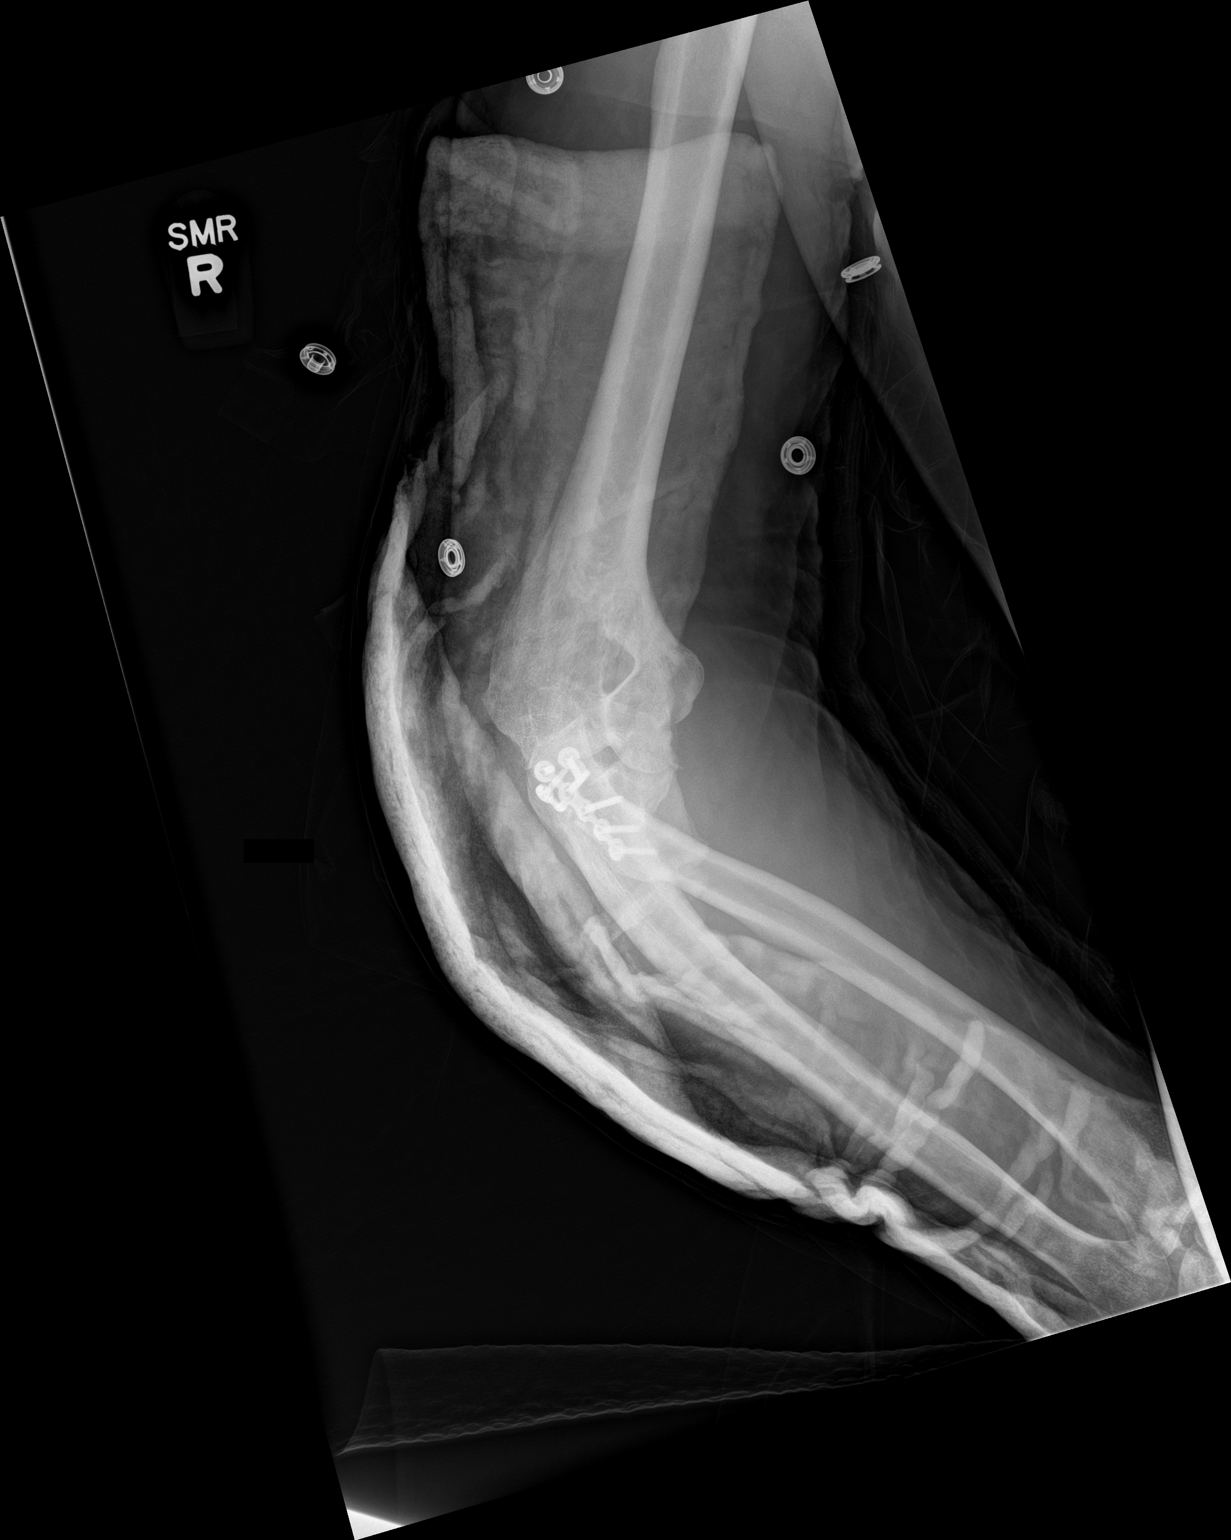

[elbow lat]
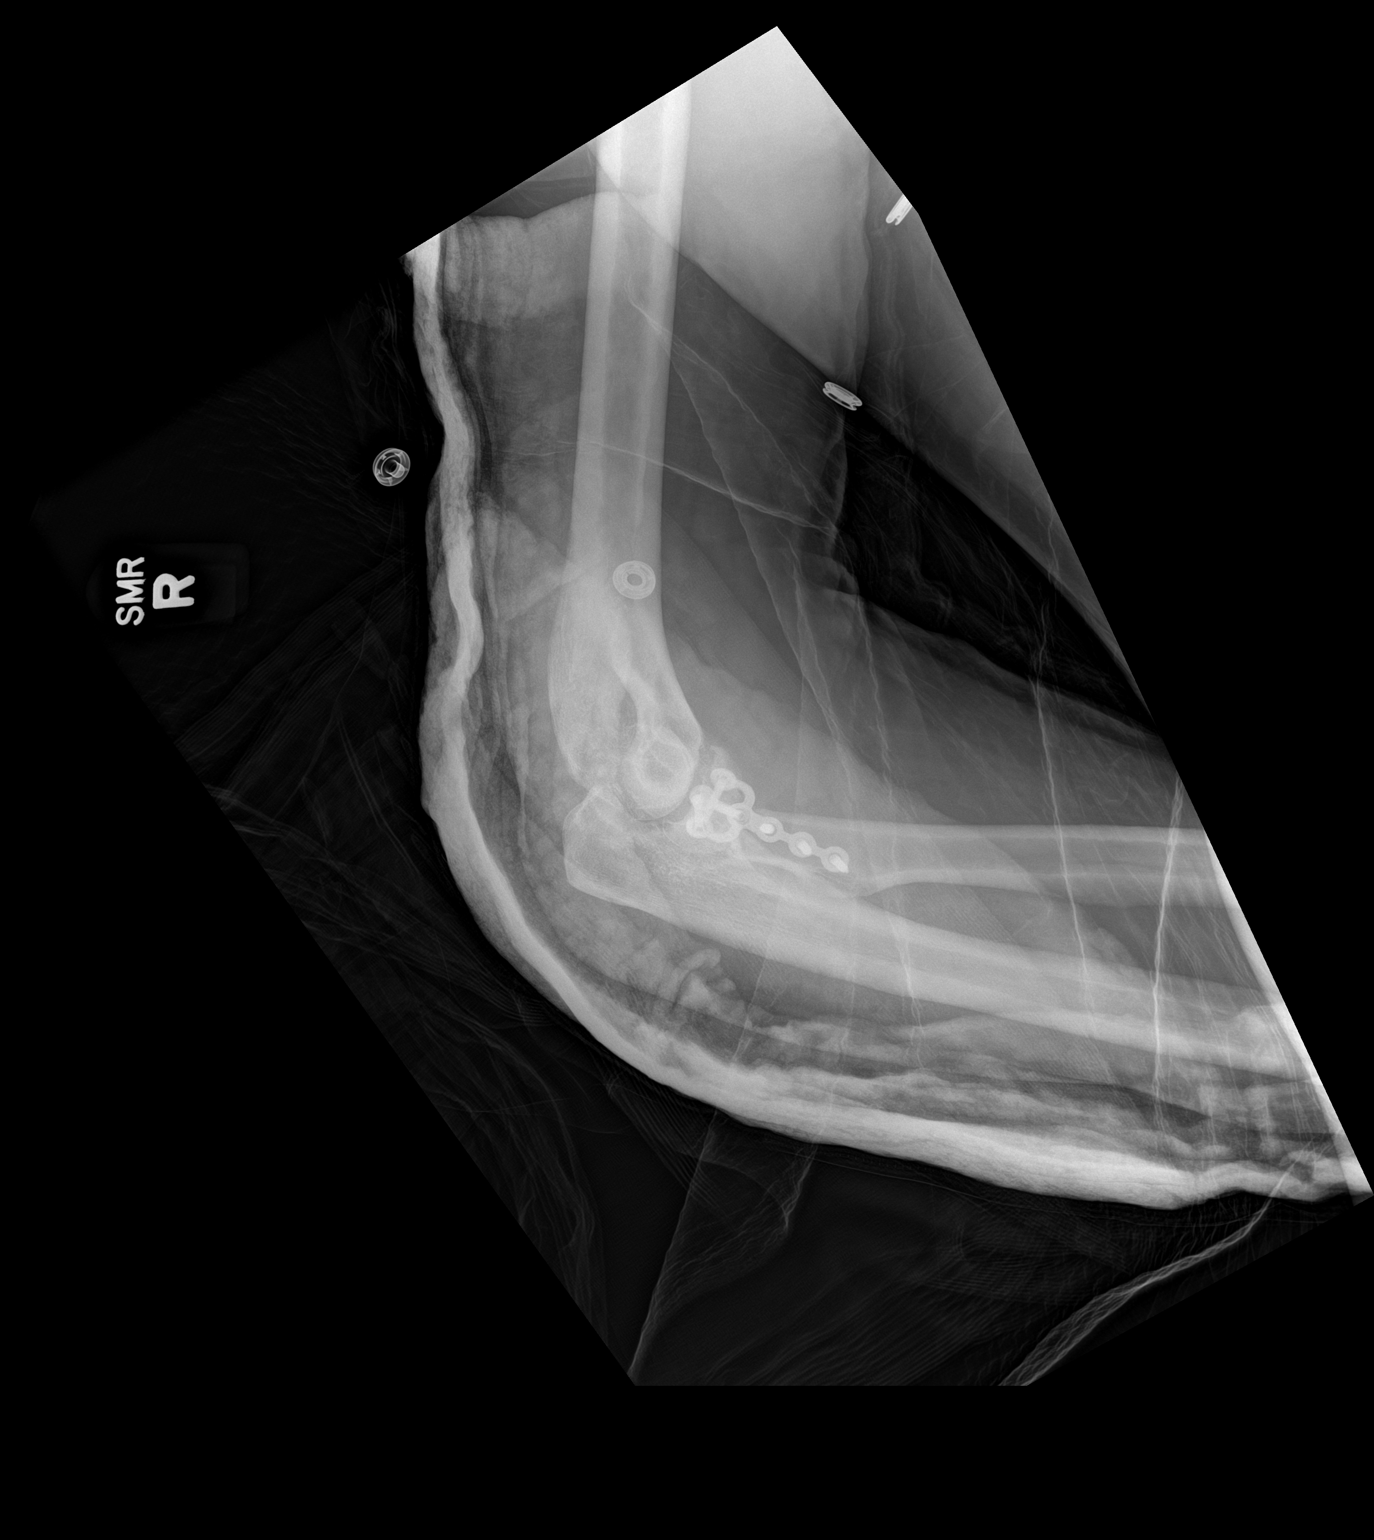

[2 of 2 positions shown; findings below may reference images not displayed]

FINDINGS: Examination is technically limited due to the patient's clinical
condition. Superimposed plaster splint also limits evaluation. There
is interval internal fixation of fracture of proximal right radius.
There is metallic sideplate and multiple surgical screws.
IMPRESSION: Interval internal fixation of fracture of proximal right radius.

## 2023-04-14 IMAGING — RF DG ELBOW 2V*L*
1 series · 2 of 2 positions shown · non-contrast
Comparison: Left elbow x-ray 08/11/2021.

CLINICAL DATA: ORIF elbow/olecranon fracture.

EXAM:
LEFT ELBOW - 2 VIEW

[Series 1: run · 2 of 2 slices shown]
[im 1/2]
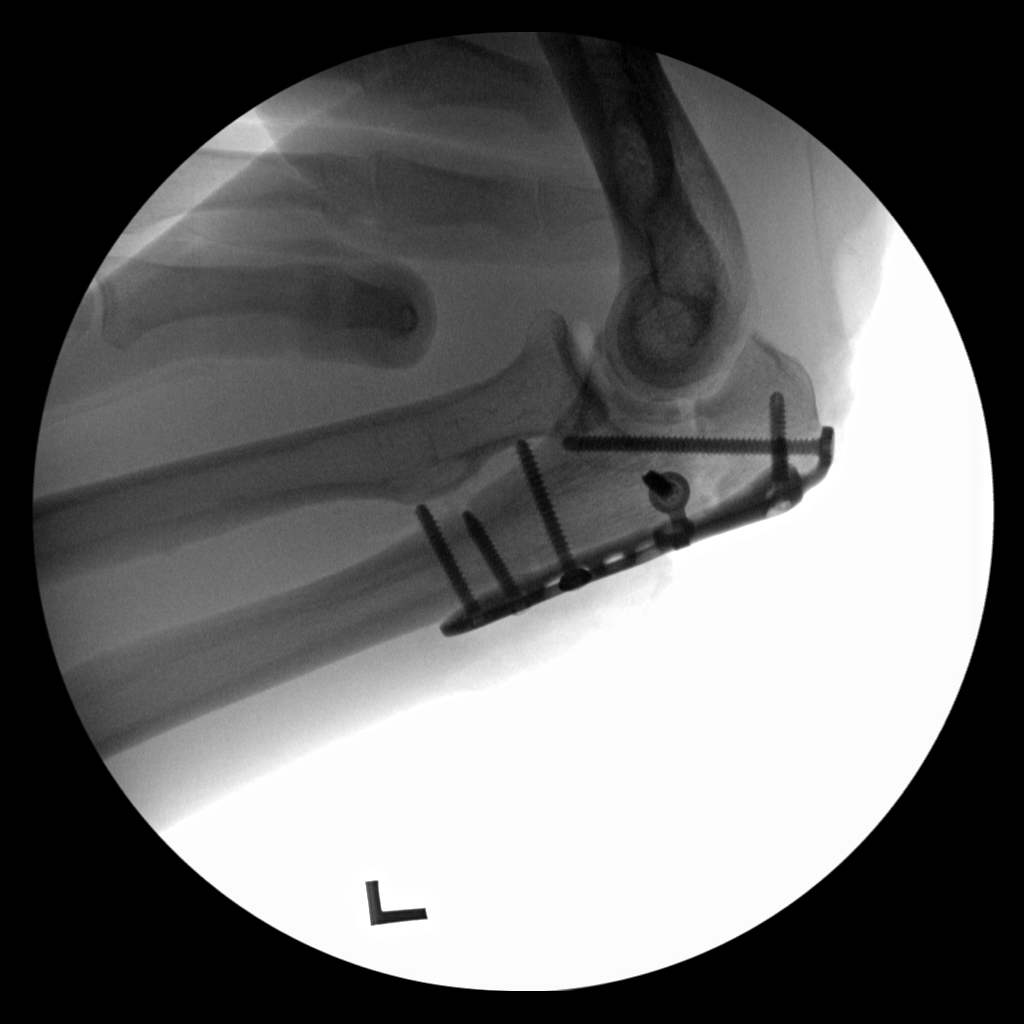
[im 2/2]
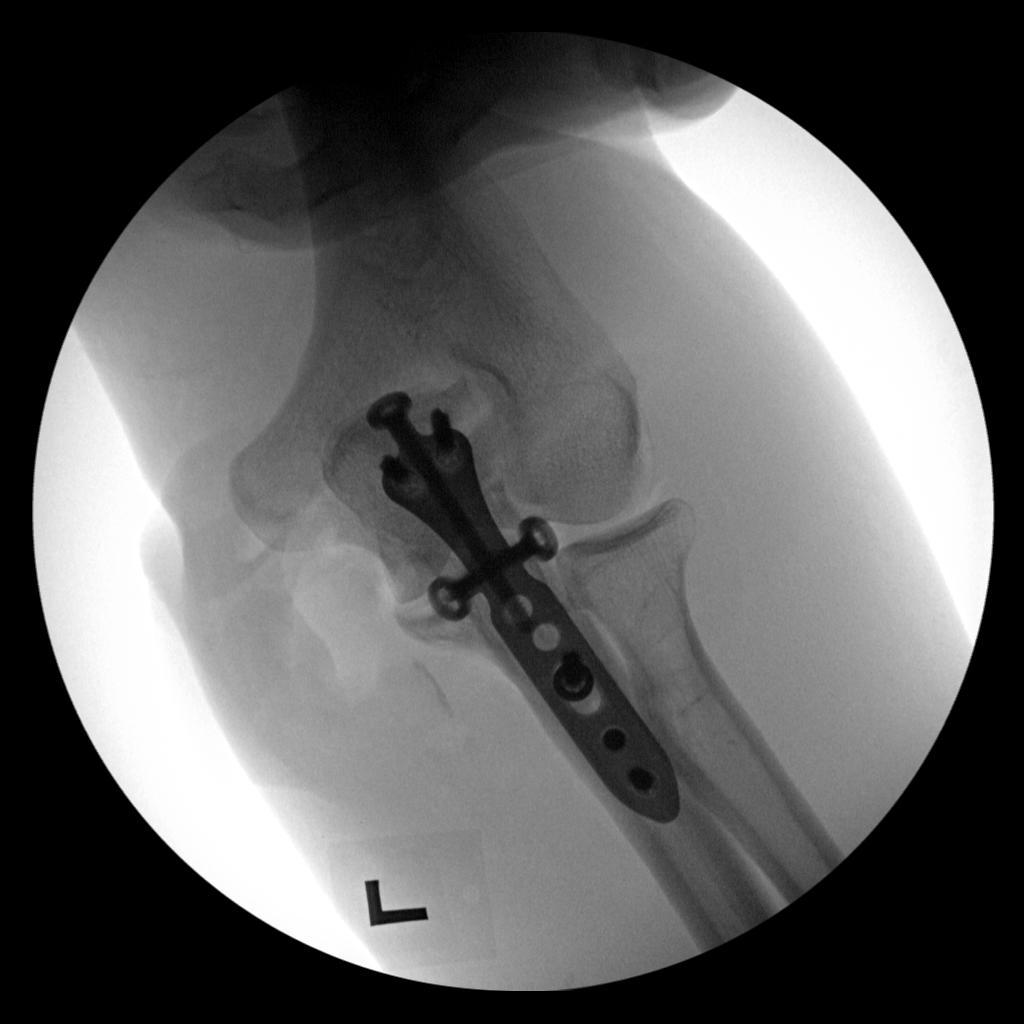

[2 of 2 positions shown; findings below may reference images not displayed]

FINDINGS: Intraoperative left elbow.

Two low resolution intraoperative spot views of the left elbow were
obtained. ORIF left olecranon fracture. Alignment is anatomic.

Total fluoroscopy time: 44 seconds

Total radiation dose: 2.38 micro Gy
IMPRESSION: 1. Intraoperative ORIF left olecranon fracture.

## 2023-07-21 DIAGNOSIS — Z139 Encounter for screening, unspecified: Secondary | ICD-10-CM | POA: Diagnosis not present

## 2023-07-21 DIAGNOSIS — Z0000 Encounter for general adult medical examination without abnormal findings: Secondary | ICD-10-CM | POA: Diagnosis not present

## 2023-07-21 DIAGNOSIS — Z1389 Encounter for screening for other disorder: Secondary | ICD-10-CM | POA: Diagnosis not present

## 2023-07-21 DIAGNOSIS — Z136 Encounter for screening for cardiovascular disorders: Secondary | ICD-10-CM | POA: Diagnosis not present

## 2023-07-21 DIAGNOSIS — Z1331 Encounter for screening for depression: Secondary | ICD-10-CM | POA: Diagnosis not present

## 2023-07-21 DIAGNOSIS — Z1339 Encounter for screening examination for other mental health and behavioral disorders: Secondary | ICD-10-CM | POA: Diagnosis not present

## 2023-08-02 DIAGNOSIS — Z131 Encounter for screening for diabetes mellitus: Secondary | ICD-10-CM | POA: Diagnosis not present

## 2023-08-02 DIAGNOSIS — Z6822 Body mass index (BMI) 22.0-22.9, adult: Secondary | ICD-10-CM | POA: Diagnosis not present

## 2023-08-02 DIAGNOSIS — Z0000 Encounter for general adult medical examination without abnormal findings: Secondary | ICD-10-CM | POA: Diagnosis not present

## 2023-08-02 DIAGNOSIS — Z13220 Encounter for screening for lipoid disorders: Secondary | ICD-10-CM | POA: Diagnosis not present

## 2023-08-02 DIAGNOSIS — Z125 Encounter for screening for malignant neoplasm of prostate: Secondary | ICD-10-CM | POA: Diagnosis not present

## 2023-08-09 DIAGNOSIS — Z6822 Body mass index (BMI) 22.0-22.9, adult: Secondary | ICD-10-CM | POA: Diagnosis not present

## 2023-08-09 DIAGNOSIS — E785 Hyperlipidemia, unspecified: Secondary | ICD-10-CM | POA: Diagnosis not present

## 2023-08-09 DIAGNOSIS — R7303 Prediabetes: Secondary | ICD-10-CM | POA: Diagnosis not present

## 2024-02-07 DIAGNOSIS — E785 Hyperlipidemia, unspecified: Secondary | ICD-10-CM | POA: Diagnosis not present

## 2024-02-07 DIAGNOSIS — R7303 Prediabetes: Secondary | ICD-10-CM | POA: Diagnosis not present

## 2024-02-14 DIAGNOSIS — Z6821 Body mass index (BMI) 21.0-21.9, adult: Secondary | ICD-10-CM | POA: Diagnosis not present

## 2024-02-14 DIAGNOSIS — R7303 Prediabetes: Secondary | ICD-10-CM | POA: Diagnosis not present

## 2024-02-14 DIAGNOSIS — E785 Hyperlipidemia, unspecified: Secondary | ICD-10-CM | POA: Diagnosis not present

## 2024-08-14 DIAGNOSIS — E785 Hyperlipidemia, unspecified: Secondary | ICD-10-CM | POA: Diagnosis not present

## 2024-08-14 DIAGNOSIS — R7303 Prediabetes: Secondary | ICD-10-CM | POA: Diagnosis not present

## 2024-08-14 DIAGNOSIS — Z125 Encounter for screening for malignant neoplasm of prostate: Secondary | ICD-10-CM | POA: Diagnosis not present

## 2024-08-21 DIAGNOSIS — Z2821 Immunization not carried out because of patient refusal: Secondary | ICD-10-CM | POA: Diagnosis not present

## 2024-08-21 DIAGNOSIS — R7303 Prediabetes: Secondary | ICD-10-CM | POA: Diagnosis not present

## 2024-08-21 DIAGNOSIS — Z6821 Body mass index (BMI) 21.0-21.9, adult: Secondary | ICD-10-CM | POA: Diagnosis not present

## 2024-08-21 DIAGNOSIS — E785 Hyperlipidemia, unspecified: Secondary | ICD-10-CM | POA: Diagnosis not present

## 2024-08-21 DIAGNOSIS — Z1331 Encounter for screening for depression: Secondary | ICD-10-CM | POA: Diagnosis not present
